# Patient Record
Sex: Male | Born: 1995 | Race: White | Hispanic: No | Marital: Single | Smoking: Never smoker
Health system: Southern US, Community
[De-identification: ages and names within clinical notes are randomized; demographics above are authoritative.]

## PROBLEM LIST (undated history)

## (undated) DIAGNOSIS — J45909 Unspecified asthma, uncomplicated: Secondary | ICD-10-CM

---
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
## 2017-05-25 ENCOUNTER — Encounter: Payer: Self-pay | Admitting: Emergency Medicine

## 2017-05-25 ENCOUNTER — Other Ambulatory Visit: Payer: Self-pay

## 2017-05-25 ENCOUNTER — Emergency Department
Admission: EM | Admit: 2017-05-25 | Discharge: 2017-05-25 | Disposition: A | Discharge status: Home / Self Care | Payer: Self-pay | Attending: Internal Medicine | Admitting: Internal Medicine

## 2017-05-25 DIAGNOSIS — J45909 Unspecified asthma, uncomplicated: Secondary | ICD-10-CM | POA: Insufficient documentation

## 2017-05-25 DIAGNOSIS — B349 Viral infection, unspecified: Secondary | ICD-10-CM | POA: Insufficient documentation

## 2017-05-25 DIAGNOSIS — J029 Acute pharyngitis, unspecified: Secondary | ICD-10-CM | POA: Insufficient documentation

## 2017-05-25 DIAGNOSIS — Z79899 Other long term (current) drug therapy: Secondary | ICD-10-CM | POA: Insufficient documentation

## 2017-05-25 LAB — GROUP A STREP BY PCR: GROUP A STREP BY PCR: NOT DETECTED

## 2017-05-25 MED ORDER — LIDOCAINE VISCOUS 2 % MT SOLN: 10.0000 mL | OROMUCOSAL | 0 refills | Status: DC | PRN

## 2017-05-25 NOTE — ED Provider Notes (Signed)
[facility] Emergency Department Provider Note  ____________________________________________  Time seen: Approximately 8:08 AM  I have reviewed the triage vital signs and the nursing notes.   HISTORY  Chief Complaint Sore Throat    HPI Eric Odonnell is a 22 y.o. male that presents to the emergency department for evaluation of sore throat for 2 days.  He has had a little bit of rhinorrhea.  No fever, nasal congestion, cough, shortness of breath, chest pain, nausea, vomiting, abdominal pain.  Past Medical History:  Diagnosis Date  . Asthma     There are no active problems to display for this patient.   History reviewed. No pertinent surgical history.  Prior to Admission medications   Medication Sig Start Date End Date Taking? Authorizing Provider  ibuprofen (ADVIL,MOTRIN) 600 MG tablet Take 1 tablet (600 mg total) by mouth every 8 (eight) hours as needed. 06/04/16   Tommi RumpsSummers, Rhonda L, PA-C  ibuprofen (ADVIL,MOTRIN) 600 MG tablet Take 1 tablet (600 mg total) by mouth every 8 (eight) hours as needed. 06/05/16   Joni ReiningSmith, Ronald K, PA-C  lidocaine (XYLOCAINE) 2 % solution Use as directed 10 mLs in the mouth or throat as needed for mouth pain. 05/25/17   Enid DerryWagner, Josey Dettmann, PA-C  naproxen (NAPROSYN) 500 MG tablet Take 1 tablet (500 mg total) by mouth 2 (two) times daily with a meal. 08/29/16   Triplett, Cari B, FNP  naproxen (NAPROSYN) 500 MG tablet Take 1 tablet (500 mg total) by mouth 2 (two) times daily with a meal. 11/01/16   Joni ReiningSmith, Ronald K, PA-C  traMADol (ULTRAM) 50 MG tablet Take 1 tablet (50 mg total) by mouth every 6 (six) hours as needed for moderate pain. 06/05/16   Joni ReiningSmith, Ronald K, PA-C  traMADol (ULTRAM) 50 MG tablet Take 1 tablet (50 mg total) by mouth every 6 (six) hours as needed for moderate pain. 11/01/16   Joni ReiningSmith, Ronald K, PA-C    Allergies Patient has no known allergies.  No family history on file.  Social History Social History   Tobacco Use  .  Smoking status: Never Smoker  . Smokeless tobacco: Current User    Types: Chew  Substance Use Topics  . Alcohol use: No  . Drug use: No     Review of Systems  Constitutional: No fever/chills Eyes: No visual changes. No discharge. ENT: Positive for congestion.  Positive for rhinorrhea. Cardiovascular: No chest pain. Respiratory: Positive for cough. No SOB. Gastrointestinal: No abdominal pain.  No nausea, no vomiting.  No diarrhea.  No constipation. Musculoskeletal: Negative for musculoskeletal pain. Skin: Negative for rash, abrasions, lacerations, ecchymosis. Neurological: Negative for headaches.   ____________________________________________   PHYSICAL EXAM:  VITAL SIGNS: ED Triage Vitals  Enc Vitals Group     BP 05/25/17 0648 (!) 114/54     Pulse Rate 05/25/17 0648 (!) 52     Resp 05/25/17 0648 20     Temp 05/25/17 0648 (!) 97.4 F (36.3 C)     Temp Source 05/25/17 0648 Oral     SpO2 05/25/17 0648 100 %     Weight 05/25/17 0641 198 lb (89.8 kg)     Height 05/25/17 0641 6\' 2"  (1.88 m)     Head Circumference --      Peak Flow --      Pain Score 05/25/17 0641 5     Pain Loc --      Pain Edu? --      Excl. in GC? --  Constitutional: Alert and oriented. Well appearing and in no acute distress. Eyes: Conjunctivae are normal. PERRL. EOMI. No discharge. Head: Atraumatic. ENT: No frontal and maxillary sinus tenderness.      Ears: Tympanic membranes pearly gray with good landmarks. No discharge.      Nose: Mild congestion/rhinnorhea.      Mouth/Throat: Mucous membranes are moist. Oropharynx mildly erythematous. Tonsils not enlarged. No exudates. Uvula midline. Neck: No stridor.   Hematological/Lymphatic/Immunilogical: No cervical lymphadenopathy. Cardiovascular: Normal rate, regular rhythm.  Good peripheral circulation. Respiratory: Normal respiratory effort without tachypnea or retractions. Lungs CTAB. Good air entry to the bases with no decreased or absent breath  sounds. Gastrointestinal: Bowel sounds 4 quadrants. Soft and nontender to palpation. No guarding or rigidity. No palpable masses. No distention. Musculoskeletal: Full range of motion to all extremities. No gross deformities appreciated. Neurologic:  Normal speech and language. No gross focal neurologic deficits are appreciated.  Skin:  Skin is warm, dry and intact. No rash noted. Psychiatric: Mood and affect are normal. Speech and behavior are normal. Patient exhibits appropriate insight and judgement.   ____________________________________________   LABS (all labs ordered are listed, but only abnormal results are displayed)  Labs Reviewed  GROUP A STREP BY PCR   ____________________________________________  EKG   ____________________________________________  RADIOLOGY   No results found.  ____________________________________________    PROCEDURES  Procedure(s) performed:    Procedures    Medications - No data to display   ____________________________________________   INITIAL IMPRESSION / ASSESSMENT AND PLAN / ED COURSE  Pertinent labs & imaging results that were available during my care of the patient were reviewed by me and considered in my medical decision making (see chart for details).  Review of the [place] CSRS was performed in accordance of the [facility] prior to dispensing any controlled drugs.     Patient's diagnosis is consistent with viral pharyngitis. Vital signs and exam are reassuring.  Strep test is negative.  Patient appears well and is staying well hydrated. Patient feels comfortable going home. Patient will be discharged home with prescriptions for viscous lidocaine. Patient is to follow up with PCP as needed or otherwise directed. Patient is given ED precautions to return to the ED for any worsening or new symptoms.     ____________________________________________  FINAL CLINICAL IMPRESSION(S) / ED DIAGNOSES  Final diagnoses:  Viral  pharyngitis      NEW MEDICATIONS STARTED DURING THIS VISIT:  ED Discharge Orders        Ordered    lidocaine (XYLOCAINE) 2 % solution  As needed     05/25/17 0811          This chart was dictated using voice recognition software/Dragon. Despite best efforts to proofread, errors can occur which can change the meaning. Any change was purely unintentional.    Enid Derry, PA-C 05/25/17 0831    Emily Filbert, MD 05/25/17 [phone]

## 2017-05-25 NOTE — ED Notes (Signed)
See triage note  Presents with sore throat for the past 2 days   Unsure of fever...afebrile on arrival  States having increased pain with swallowing

## 2017-05-25 NOTE — ED Triage Notes (Signed)
Patient ambulatory to triage with steady gait, without difficulty or distress noted; pt reports sore throat x 2 days 

## 2019-02-02 ENCOUNTER — Other Ambulatory Visit: Payer: Self-pay

## 2019-02-02 ENCOUNTER — Encounter: Payer: Self-pay | Admitting: Emergency Medicine

## 2019-02-02 ENCOUNTER — Emergency Department
Admission: EM | Admit: 2019-02-02 | Discharge: 2019-02-02 | Disposition: A | Discharge status: Home / Self Care | Payer: Medicaid Other | Attending: Emergency Medicine | Admitting: Emergency Medicine

## 2019-02-02 DIAGNOSIS — F17220 Nicotine dependence, chewing tobacco, uncomplicated: Secondary | ICD-10-CM | POA: Insufficient documentation

## 2019-02-02 DIAGNOSIS — X503XXA Overexertion from repetitive movements, initial encounter: Secondary | ICD-10-CM | POA: Insufficient documentation

## 2019-02-02 DIAGNOSIS — S39012A Strain of muscle, fascia and tendon of lower back, initial encounter: Secondary | ICD-10-CM

## 2019-02-02 DIAGNOSIS — Y999 Unspecified external cause status: Secondary | ICD-10-CM | POA: Insufficient documentation

## 2019-02-02 DIAGNOSIS — Y929 Unspecified place or not applicable: Secondary | ICD-10-CM | POA: Insufficient documentation

## 2019-02-02 DIAGNOSIS — Y9389 Activity, other specified: Secondary | ICD-10-CM | POA: Insufficient documentation

## 2019-02-02 DIAGNOSIS — J45909 Unspecified asthma, uncomplicated: Secondary | ICD-10-CM | POA: Insufficient documentation

## 2019-02-02 MED ORDER — NAPROXEN 500 MG PO TABS: 500.0000 mg | ORAL_TABLET | Freq: Two times a day (BID) | ORAL | 0 refills | Status: AC

## 2019-02-02 MED ORDER — KETOROLAC TROMETHAMINE 30 MG/ML IJ SOLN
30.0000 mg | Freq: Once | INTRAMUSCULAR | Status: AC
  Administered 2019-02-02: 11:00:00 30 mg via INTRAMUSCULAR
  Filled 2019-02-02: qty 1

## 2019-02-02 NOTE — ED Notes (Signed)
See triage note  Presents with lower back pain  States he thinks he may have hurt his back on Friday  Pain was on left and moved into left leg  States now pain is only in left lower back  Ambulates well to treatment room

## 2019-02-02 NOTE — ED Provider Notes (Signed)
[facility] Emergency Department Provider Note   ____________________________________________   First MD Initiated Contact with Patient 02/02/19 [phone]     (approximate)  I have reviewed the triage vital signs and the nursing notes.   HISTORY  Chief Complaint Back Pain   HPI Eric Odonnell is a 23 y.o. male presents to the ED with complaint of low back pain.  Patient states that he was shoveling gravel 6 days ago and afterwards he had pain.  He states that there was no injury and that he did not fall and nothing fell on him.  He has taken ibuprofen 800 mg 1 a day.  He denies any previous back problems.  There is been no saddle anesthesias or incontinence of bowel or bladder.  He does have some mild isolated radiation to his left hip area.  He rates his pain as an 8 out of 10.      Past Medical History:  Diagnosis Date  . Asthma     There are no active problems to display for this patient.   History reviewed. No pertinent surgical history.  Prior to Admission medications   Medication Sig Start Date End Date Taking? Authorizing Provider  naproxen (NAPROSYN) 500 MG tablet Take 1 tablet (500 mg total) by mouth 2 (two) times daily with a meal. 02/02/19   Johnn Hai, PA-C    Allergies Patient has no known allergies.  No family history on file.  Social History Social History   Tobacco Use  . Smoking status: Never Smoker  . Smokeless tobacco: Current User    Types: Chew  Substance Use Topics  . Alcohol use: No  . Drug use: No    Review of Systems Constitutional: No fever/chills Cardiovascular: Denies chest pain. Respiratory: Denies shortness of breath. Gastrointestinal: No abdominal pain.  No nausea, no vomiting.  No diarrhea.  Genitourinary: Negative for dysuria. Musculoskeletal: Positive for low back pain. Skin: Negative for rash. Neurological: Negative for headaches, focal weakness or numbness.  ____________________________________________   PHYSICAL EXAM:  VITAL SIGNS: ED Triage Vitals  Enc Vitals Group     BP 02/02/19 0843 (!) 143/72     Pulse Rate 02/02/19 0843 68     Resp 02/02/19 0843 20     Temp 02/02/19 0843 97.8 F (36.6 C)     Temp Source 02/02/19 0843 Oral     SpO2 02/02/19 0843 100 %     Weight 02/02/19 0840 200 lb (90.7 kg)     Height 02/02/19 0840 6\' 1"  (1.854 m)     Head Circumference --      Peak Flow --      Pain Score 02/02/19 0840 8     Pain Loc --      Pain Edu? --      Excl. in [place]? --     Constitutional: Alert and oriented. Well appearing and in no acute distress. Eyes: Conjunctivae are normal.  Head: Atraumatic. Neck: No stridor.   Cardiovascular: Normal rate, regular rhythm. Grossly normal heart sounds.  Good peripheral circulation. Respiratory: Normal respiratory effort.  No retractions. Lungs CTAB. Gastrointestinal: Soft and nontender. No distention.  No CVA tenderness. Musculoskeletal: On examination of the back there is no gross deformity.  There is marked tenderness on palpation of the sacral area and SI joint on the left.  There is no active muscle spasms.  There is no evidence of injury such as discoloration or abrasions.  Good muscle strength bilaterally.  Straight  leg raises are minimally positive on the left. Neurologic:  Normal speech and language. No gross focal neurologic deficits are appreciated.  Reflexes are 1+ bilaterally.  No gait instability. Skin:  Skin is warm, dry and intact. No rash noted. Psychiatric: Mood and affect are normal. Speech and behavior are normal.  ____________________________________________   LABS (all labs ordered are listed, but only abnormal results are displayed)  Labs Reviewed - No data to display  PROCEDURES  Procedure(s) performed (including Critical Care):  Procedures   ____________________________________________   INITIAL IMPRESSION / ASSESSMENT AND PLAN / ED COURSE  As part of my  medical decision making, I reviewed the following data within the electronic MEDICAL RECORD NUMBER Notes from prior ED visits and [place] Controlled Substance Database  23 year old male presents to the ED with complaint of low back pain.  Patient states that the pain has been there for 6 days.  He has been taking ibuprofen 800 mg once a day.  He denies any direct trauma to his back and states that he was using a shovel when his back began hurting.  Neurologically he is intact.  Patient had minimal tenderness on palpation of the lumbar spine but was more tender on the left SI joint area.  Patient was given Toradol injection 30 mg while in the ED.  He was discharged with a prescription for naproxen 500 mg twice daily which may be easier for him to take since he does not like taking medication.  He is to follow-up with his PCP if any continued problems.  ____________________________________________   FINAL CLINICAL IMPRESSION(S) / ED DIAGNOSES  Final diagnoses:  Strain of lumbar region, initial encounter     ED Discharge Orders         Ordered    naproxen (NAPROSYN) 500 MG tablet  2 times daily with meals     02/02/19 1130           Note:  This document was prepared using Dragon voice recognition software and may include unintentional dictation errors.    Tommi Rumps, PA-C 02/02/19 1138    Jene Every, MD 02/02/19 1255

## 2019-02-02 NOTE — ED Triage Notes (Signed)
Pt reports at work Friday he was shoveling gravel and hurt his lower back. Pt reports pain radiates down his left leg as well. Pt does not wish to file workers comp.

## 2019-02-02 NOTE — Discharge Instructions (Signed)
Follow-up with your primary care provider if any continued problems.  Begin taking medication as directed.  This medication will be easier for you to take as it is only twice a day instead of 3 times a day like or ibuprofen.  Use ice or heat to your back as needed for discomfort.  If any severe worsening of your symptoms over the weekend return to the emergency department.

## 2019-04-01 ENCOUNTER — Encounter: Payer: Self-pay | Admitting: Emergency Medicine

## 2019-04-01 ENCOUNTER — Emergency Department
Admission: EM | Admit: 2019-04-01 | Discharge: 2019-04-01 | Disposition: A | Discharge status: Home / Self Care | Payer: Medicaid Other | Attending: Emergency Medicine | Admitting: Emergency Medicine

## 2019-04-01 ENCOUNTER — Other Ambulatory Visit: Payer: Self-pay

## 2019-04-01 DIAGNOSIS — Z791 Long term (current) use of non-steroidal anti-inflammatories (NSAID): Secondary | ICD-10-CM | POA: Insufficient documentation

## 2019-04-01 DIAGNOSIS — A09 Infectious gastroenteritis and colitis, unspecified: Secondary | ICD-10-CM | POA: Insufficient documentation

## 2019-04-01 DIAGNOSIS — J45909 Unspecified asthma, uncomplicated: Secondary | ICD-10-CM | POA: Insufficient documentation

## 2019-04-01 DIAGNOSIS — Z20822 Contact with and (suspected) exposure to covid-19: Secondary | ICD-10-CM | POA: Insufficient documentation

## 2019-04-01 LAB — COMPREHENSIVE METABOLIC PANEL
ALT: 18 U/L (ref 0–44)
AST: 17 U/L (ref 15–41)
Albumin: 4.8 g/dL (ref 3.5–5.0)
Alkaline Phosphatase: 71 U/L (ref 38–126)
Anion gap: 12 (ref 5–15)
BUN: 11 mg/dL (ref 6–20)
CO2: 26 mmol/L (ref 22–32)
Calcium: 9.5 mg/dL (ref 8.9–10.3)
Chloride: 99 mmol/L (ref 98–111)
Creatinine, Ser: 0.84 mg/dL (ref 0.61–1.24)
GFR calc Af Amer: >60 mL/min (ref >60)
GFR calc non Af Amer: >60 mL/min (ref >60)
Glucose, Bld: 100 mg/dL — ABNORMAL HIGH (ref 70–99)
Potassium: 3.4 mmol/L — ABNORMAL LOW (ref 3.5–5.1)
Sodium: 137 mmol/L (ref 135–145)
Total Bilirubin: 1.1 mg/dL (ref 0.3–1.2)
Total Protein: 8.5 g/dL — ABNORMAL HIGH (ref 6.5–8.1)

## 2019-04-01 LAB — C DIFFICILE QUICK SCREEN W PCR REFLEX
C Diff antigen: NEGATIVE
C Diff interpretation: NOT DETECTED
C Diff toxin: NEGATIVE

## 2019-04-01 LAB — URINALYSIS, COMPLETE (UACMP) WITH MICROSCOPIC
Bacteria, UA: NONE SEEN
Bilirubin Urine: NEGATIVE
Glucose, UA: NEGATIVE mg/dL
Hgb urine dipstick: NEGATIVE
Ketones, ur: NEGATIVE mg/dL
Leukocytes,Ua: NEGATIVE
Nitrite: NEGATIVE
Protein, ur: 30 mg/dL — AB
Specific Gravity, Urine: 1.038 — ABNORMAL HIGH (ref 1.005–1.030)
pH: 5 (ref 5.0–8.0)

## 2019-04-01 LAB — CBC
HCT: 42.2 % (ref 39.0–52.0)
Hemoglobin: 15.4 g/dL (ref 13.0–17.0)
MCH: 28.8 pg (ref 26.0–34.0)
MCHC: 36.5 g/dL — ABNORMAL HIGH (ref 30.0–36.0)
MCV: 78.9 fL — ABNORMAL LOW (ref 80.0–100.0)
Platelets: 237 10*3/uL (ref 150–400)
RBC: 5.35 MIL/uL (ref 4.22–5.81)
RDW: 12.1 % (ref 11.5–15.5)
WBC: 6.8 10*3/uL (ref 4.0–10.5)
nRBC: 0 % (ref 0.0–0.2)

## 2019-04-01 LAB — POC SARS CORONAVIRUS 2 AG: SARS Coronavirus 2 Ag: NEGATIVE

## 2019-04-01 LAB — LIPASE, BLOOD: Lipase: 23 U/L (ref 11–51)

## 2019-04-01 MED ORDER — LOPERAMIDE HCL 2 MG PO CAPS
4.0000 mg | ORAL_CAPSULE | Freq: Once | ORAL | Status: AC
  Administered 2019-04-01: 22:00:00 4 mg via ORAL
  Filled 2019-04-01: qty 2

## 2019-04-01 MED ORDER — SODIUM CHLORIDE 0.9 % IV BOLUS
1000.0000 mL | Freq: Once | INTRAVENOUS | Status: AC
  Administered 2019-04-01: 21:00:00 1000 mL via INTRAVENOUS

## 2019-04-01 MED ORDER — LOPERAMIDE HCL 2 MG PO TABS: 2.0000 mg | ORAL_TABLET | Freq: Four times a day (QID) | ORAL | 0 refills | Status: AC | PRN

## 2019-04-01 MED ORDER — CIPROFLOXACIN HCL 500 MG PO TABS: 500.0000 mg | ORAL_TABLET | Freq: Two times a day (BID) | ORAL | 0 refills | Status: AC

## 2019-04-01 NOTE — ED Triage Notes (Signed)
Pt arrived via POV with reports of after eating dinner last night started having diarrhea and vomiting along with abdominal pain.   Pt states other people ate the same food and did not get sick.  Pt c/o tightness in abdomen.

## 2019-04-01 NOTE — ED Provider Notes (Signed)
[facility] Emergency Department Provider Note  ____________________________________________   First MD Initiated Contact with Patient 04/01/19 2004     (approximate)  I have reviewed the triage vital signs and the nursing notes.   HISTORY  Chief Complaint   HPI Eric Odonnell is a 24 y.o. male presents to the emergency department secondary to a 2-day history of nonbloody diarrhea.  Patient denies any nausea or vomiting.  Patient denies any fever.  Patient does admit to abdominal "tightness" however no pain.  Patient denies any sick contact.        Past Medical History:  Diagnosis Date  . Asthma     There are no problems to display for this patient.   History reviewed. No pertinent surgical history.  Prior to Admission medications   Medication Sig Start Date End Date Taking? Authorizing Provider  naproxen (NAPROSYN) 500 MG tablet Take 1 tablet (500 mg total) by mouth 2 (two) times daily with a meal. 02/02/19   Tommi Rumps, PA-C    Allergies Patient has no known allergies.  No family history on file.  Social History Social History   Tobacco Use  . Smoking status: Never Smoker  . Smokeless tobacco: Current User    Types: Chew  Substance Use Topics  . Alcohol use: No  . Drug use: No    Review of Systems Constitutional: No fever/chills Eyes: No visual changes. ENT: No sore throat. Cardiovascular: Denies chest pain. Respiratory: Denies shortness of breath. Gastrointestinal: No abdominal pain.  No nausea, no vomiting.  Positive for diarrhea.  No constipation. Genitourinary: Negative for dysuria. Musculoskeletal: Negative for neck pain.  Negative for back pain. Integumentary: Negative for rash. Neurological: Negative for headaches, focal weakness or numbness.   ____________________________________________   PHYSICAL EXAM:  VITAL SIGNS: ED Triage Vitals  Enc Vitals Group     BP 04/01/19 1602 124/79     Pulse Rate  04/01/19 1602 85     Resp 04/01/19 1602 17     Temp 04/01/19 1602 97.9 F (36.6 C)     Temp Source 04/01/19 1602 Oral     SpO2 04/01/19 1602 100 %     Weight 04/01/19 1602 90.7 kg (200 lb)     Height 04/01/19 1602 1.854 m (6\' 1" )     Head Circumference --      Peak Flow --      Pain Score 04/01/19 1612 7     Pain Loc --      Pain Edu? --      Excl. in GC? --     Constitutional: Alert and oriented.  Eyes: Conjunctivae are normal.  Mouth/Throat: Patient is wearing a mask. Neck: No stridor.  No meningeal signs.   Cardiovascular: Normal rate, regular rhythm. Good peripheral circulation. Grossly normal heart sounds. Respiratory: Normal respiratory effort.  No retractions. Gastrointestinal: Soft and nontender. No distention.   Musculoskeletal: No lower extremity tenderness nor edema. No gross deformities of extremities. Neurologic:  Normal speech and language. No gross focal neurologic deficits are appreciated.  Skin:  Skin is warm, dry and intact. Psychiatric: Mood and affect are normal. Speech and behavior are normal.  ____________________________________________   LABS (all labs ordered are listed, but only abnormal results are displayed)  Labs Reviewed  COMPREHENSIVE METABOLIC PANEL - Abnormal; Notable for the following components:      Result Value   Potassium 3.4 (*)    Glucose, Bld 100 (*)    Total Protein 8.5 (*)  All other components within normal limits  CBC - Abnormal; Notable for the following components:   MCV 78.9 (*)    MCHC 36.5 (*)    All other components within normal limits  URINALYSIS, COMPLETE (UACMP) WITH MICROSCOPIC - Abnormal; Notable for the following components:   Color, Urine AMBER (*)    APPearance HAZY (*)    Specific Gravity, Urine 1.038 (*)    Protein, ur 30 (*)    All other components within normal limits  GI PATHOGEN PANEL BY PCR, STOOL  C DIFFICILE QUICK SCREEN W PCR REFLEX  LIPASE, BLOOD      Procedures   ____________________________________________   INITIAL IMPRESSION / MDM / ASSESSMENT AND PLAN / ED COURSE  As part of my medical decision making, I reviewed the following data within the electronic MEDICAL RECORD NUMBER   24 year old male presented with above-stated history and physical exam consistent with infectious diarrhea.  Given absence of abdominal pain no imaging performed.  Patient given Imodium in the emergency department after stool samples obtained.  Stool samples pending at this time.  Patient also received 2 L IV normal saline     ____________________________________________  FINAL CLINICAL IMPRESSION(S) / ED DIAGNOSES  Final diagnoses:  Infectious diarrhea     MEDICATIONS GIVEN DURING THIS VISIT:  Medications  sodium chloride 0.9 % bolus 1,000 mL (1,000 mLs Intravenous New Bag/Given 04/01/19 2048)     ED Discharge Orders    None      *Please note:  [name] was evaluated in Emergency Department on 04/01/2019 for the symptoms described in the history of present illness. He was evaluated in the context of the global COVID-19 pandemic, which necessitated consideration that the patient might be at risk for infection with the SARS-CoV-2 virus that causes COVID-19. Institutional protocols and algorithms that pertain to the evaluation of patients at risk for COVID-19 are in a state of rapid change based on information released by regulatory bodies including the CDC and federal and state organizations. These policies and algorithms were followed during the patient's care in the ED.  Some ED evaluations and interventions may be delayed as a result of limited staffing during the pandemic.*  Note:  This document was prepared using Dragon voice recognition software and may include unintentional dictation errors.   Gregor Hams, MD 04/01/19 2147

## 2019-04-03 ENCOUNTER — Ambulatory Visit: Payer: Medicaid Other | Attending: Internal Medicine

## 2019-04-03 LAB — GI PATHOGEN PANEL BY PCR, STOOL
Adenovirus F 40/41: NOT DETECTED
Astrovirus: NOT DETECTED
Campylobacter by PCR: NOT DETECTED
Cryptosporidium by PCR: NOT DETECTED
Cyclospora cayetanensis: NOT DETECTED
E coli (ETEC) LT/ST: NOT DETECTED
E coli (STEC): NOT DETECTED
Entamoeba histolytica: NOT DETECTED
Enteroaggregative E coli: NOT DETECTED
Enteropathogenic E coli: NOT DETECTED
G lamblia by PCR: NOT DETECTED
Norovirus GI/GII: NOT DETECTED
Plesiomonas shigelloides: NOT DETECTED
Rotavirus A by PCR: DETECTED — AB
Salmonella by PCR: NOT DETECTED
Sapovirus: NOT DETECTED
Shigella by PCR: NOT DETECTED
Vibrio cholerae: NOT DETECTED
Vibrio: NOT DETECTED
Yersinia enterocolitica: NOT DETECTED

## 2019-09-07 ENCOUNTER — Other Ambulatory Visit: Payer: Self-pay | Admitting: Emergency Medicine

## 2019-09-07 ENCOUNTER — Encounter: Payer: Self-pay | Admitting: Emergency Medicine

## 2019-09-07 ENCOUNTER — Emergency Department: Payer: Medicaid Other

## 2019-09-07 ENCOUNTER — Emergency Department
Admission: EM | Admit: 2019-09-07 | Discharge: 2019-09-07 | Disposition: A | Discharge status: Home / Self Care | Payer: Medicaid Other | Attending: Emergency Medicine | Admitting: Emergency Medicine

## 2019-09-07 ENCOUNTER — Other Ambulatory Visit: Payer: Self-pay

## 2019-09-07 DIAGNOSIS — R350 Frequency of micturition: Secondary | ICD-10-CM | POA: Insufficient documentation

## 2019-09-07 DIAGNOSIS — F17220 Nicotine dependence, chewing tobacco, uncomplicated: Secondary | ICD-10-CM | POA: Insufficient documentation

## 2019-09-07 DIAGNOSIS — J45909 Unspecified asthma, uncomplicated: Secondary | ICD-10-CM | POA: Insufficient documentation

## 2019-09-07 DIAGNOSIS — N201 Calculus of ureter: Secondary | ICD-10-CM | POA: Insufficient documentation

## 2019-09-07 DIAGNOSIS — R112 Nausea with vomiting, unspecified: Secondary | ICD-10-CM | POA: Insufficient documentation

## 2019-09-07 DIAGNOSIS — R1032 Left lower quadrant pain: Secondary | ICD-10-CM | POA: Insufficient documentation

## 2019-09-07 MED ORDER — KETOROLAC TROMETHAMINE 60 MG/2ML IM SOLN: 15.0000 mg | Freq: Once | INTRAMUSCULAR | Status: AC

## 2019-09-07 MED ORDER — KETOROLAC TROMETHAMINE 30 MG/ML IJ SOLN
INTRAMUSCULAR | Status: AC
  Administered 2019-09-07: 15 mg via INTRAMUSCULAR
  Filled 2019-09-07: qty 1

## 2019-09-07 MED ORDER — ONDANSETRON 4 MG PO TBDP: 4.0000 mg | ORAL_TABLET | Freq: Three times a day (TID) | ORAL | 0 refills | Status: AC | PRN

## 2019-09-07 MED ORDER — KETOROLAC TROMETHAMINE 10 MG PO TABS: 10.0000 mg | ORAL_TABLET | Freq: Four times a day (QID) | ORAL | 0 refills | Status: DC | PRN

## 2019-09-07 NOTE — ED Notes (Signed)
E-signature not working at this time. Pt verbalized understanding of D/C instructions, prescriptions and follow up care with no further questions at this time. Pt in NAD and ambulatory at time of D/C.  

## 2019-09-07 NOTE — ED Triage Notes (Signed)
Pt presents via POV with left sided abdominal pain that began last night around 10pm. Pt endorses intermittent N/V as well. Pt alert and oriented x4,

## 2019-09-07 NOTE — Discharge Instructions (Signed)
Your CT scan shows a 2-millimeter kidney stone on the left side, just outside your bladder. This should pass on its own today.  After that, your pain should dramatically improve. Take toradol every 6 hours in the meantime to control pain.

## 2019-09-07 NOTE — ED Provider Notes (Signed)
[facility] Emergency Department Provider Note  ____________________________________________  Time seen: Approximately 9:06 AM  I have reviewed the triage vital signs and the nursing notes.   HISTORY  Chief Complaint Abdominal Pain    HPI Eric Odonnell is a 24 y.o. male with past medical history of asthma who comes the ED complaining of left flank pain radiating to left lower quadrant that began last night at about 10 PM.  Constant, waxing and waning, no aggravating or alleviating factors.  Has some nausea and vomiting as well.  No fevers or chills, no diarrhea or constipation.  Does endorse urinary frequency without dysuria or penile discharge.      Past Medical History:  Diagnosis Date  . Asthma      There are no problems to display for this patient.    History reviewed. No pertinent surgical history.   Prior to Admission medications   Medication Sig Start Date End Date Taking? Authorizing Provider  ketorolac (TORADOL) 10 MG tablet Take 1 tablet (10 mg total) by mouth every 6 (six) hours as needed for moderate pain. 09/07/19   Sharman Cheek, MD  loperamide (IMODIUM A-D) 2 MG tablet Take 1 tablet (2 mg total) by mouth 4 (four) times daily as needed for diarrhea or loose stools. 04/01/19   Darci Current, MD  naproxen (NAPROSYN) 500 MG tablet Take 1 tablet (500 mg total) by mouth 2 (two) times daily with a meal. 02/02/19   Bridget Hartshorn L, PA-C  ondansetron (ZOFRAN ODT) 4 MG disintegrating tablet Take 1 tablet (4 mg total) by mouth every 8 (eight) hours as needed for nausea or vomiting. 09/07/19   Sharman Cheek, MD     Allergies Patient has no known allergies.   History reviewed. No pertinent family history.  Social History Social History   Tobacco Use  . Smoking status: Never Smoker  . Smokeless tobacco: Current User    Types: Chew  Substance Use Topics  . Alcohol use: No  . Drug use: No    Review of  Systems  Constitutional:   No fever or chills.  ENT:   No sore throat. No rhinorrhea. Cardiovascular:   No chest pain or syncope. Respiratory:   No dyspnea or cough. Gastrointestinal:   Positive as above for left flank pain and vomiting. Musculoskeletal:   Negative for focal pain or swelling All other systems reviewed and are negative except as documented above in ROS and HPI.  ____________________________________________   PHYSICAL EXAM:  VITAL SIGNS: ED Triage Vitals  Enc Vitals Group     BP 09/07/19 0751 (!) 149/81     Pulse Rate 09/07/19 0751 (!) 55     Resp 09/07/19 0751 17     Temp 09/07/19 0751 97.7 F (36.5 C)     Temp src --      SpO2 09/07/19 0751 100 %     Weight 09/07/19 0752 203 lb (92.1 kg)     Height 09/07/19 0752 6\' 1"  (1.854 m)     Head Circumference --      Peak Flow --      Pain Score 09/07/19 0752 6     Pain Loc --      Pain Edu? --      Excl. in GC? --     Vital signs reviewed, nursing assessments reviewed.   Constitutional:   Alert and oriented. Non-toxic appearance. Eyes:   Conjunctivae are normal. EOMI. PERRL. ENT      Head:  Normocephalic and atraumatic.      Nose:   Wearing a mask.      Mouth/Throat:   Wearing a mask.      Neck:   No meningismus. Full ROM. Hematological/Lymphatic/Immunilogical:   No cervical lymphadenopathy. Cardiovascular:   RRR. Symmetric bilateral radial and DP pulses.  No murmurs. Cap refill less than 2 seconds. Respiratory:   Normal respiratory effort without tachypnea/retractions. Breath sounds are clear and equal bilaterally. No wheezes/rales/rhonchi. Gastrointestinal:   Soft with mild left lower quadrant tenderness.  Non distended. There is mild left CVA tenderness.  No rebound, rigidity, or guarding.  Musculoskeletal:   Normal range of motion in all extremities. No joint effusions.  No lower extremity tenderness.  No edema. Neurologic:   Normal speech and language.  Motor grossly intact. No acute focal  neurologic deficits are appreciated.  Skin:    Skin is warm, dry and intact. No rash noted.  No petechiae, purpura, or bullae.  ____________________________________________    LABS (pertinent positives/negatives) (all labs ordered are listed, but only abnormal results are displayed) Labs Reviewed  URINALYSIS, COMPLETE (UACMP) WITH MICROSCOPIC   ____________________________________________   EKG    ____________________________________________    RADIOLOGY  CT Renal Stone Study  Result Date: 09/07/2019 CLINICAL DATA:  Left side abdominal pain EXAM: CT ABDOMEN AND PELVIS WITHOUT CONTRAST TECHNIQUE: Multidetector CT imaging of the abdomen and pelvis was performed following the standard protocol without IV contrast. COMPARISON:  None. FINDINGS: Lower chest: Lung bases are clear. No effusions. Heart is normal size. Hepatobiliary: No focal hepatic abnormality. Gallbladder unremarkable. Pancreas: No focal abnormality or ductal dilatation. Spleen: No focal abnormality.  Normal size. Adrenals/Urinary Tract: Mild left hydronephrosis and perinephric stranding due to punctate 2 mm distal left ureteral stone. No stones or hydronephrosis on the right. Adrenal glands and urinary bladder unremarkable. Stomach/Bowel: Normal appendix. Stomach, large and small bowel grossly unremarkable. Vascular/Lymphatic: No evidence of aneurysm or adenopathy. Reproductive: No visible focal abnormality. Other: No free fluid or free air. Musculoskeletal: No acute bony abnormality. IMPRESSION: 2 mm distal left ureteral stone with mild left hydronephrosis and perinephric stranding. Electronically Signed   By: Rolm Baptise M.D.   On: 09/07/2019 08:29    ____________________________________________   PROCEDURES Procedures  ____________________________________________  DIFFERENTIAL DIAGNOSIS   Ureterolithiasis, ureteral obstruction, constipation, intestinal gas  CLINICAL IMPRESSION / ASSESSMENT AND PLAN / ED  COURSE  Medications ordered in the ED: Medications  ketorolac (TORADOL) injection 15 mg (15 mg Intramuscular Given 09/07/19 0803)    Pertinent labs & imaging results that were available during my care of the patient were reviewed by me and considered in my medical decision making (see chart for details).  Eric Odonnell was evaluated in Emergency Department on 09/07/2019 for the symptoms described in the history of present illness. He was evaluated in the context of the global COVID-19 pandemic, which necessitated consideration that the patient might be at risk for infection with the SARS-CoV-2 virus that causes COVID-19. Institutional protocols and algorithms that pertain to the evaluation of patients at risk for COVID-19 are in a state of rapid change based on information released by regulatory bodies including the CDC and federal and state organizations. These policies and algorithms were followed during the patient's care in the ED.   Patient presents with left flank pain, most likely kidney stone.  Vital signs are normal, symptoms and exam not consistent with UTI or pyelonephritis.  CT scan obtained which does show a 2 mm stone in the distal  left ureter, not obstructive.  Will give Toradol and Zofran for symptom control, plan outpatient follow-up.      ____________________________________________   FINAL CLINICAL IMPRESSION(S) / ED DIAGNOSES    Final diagnoses:  Ureterolithiasis     ED Discharge Orders         Ordered    ketorolac (TORADOL) 10 MG tablet  Every 6 hours PRN     Discontinue  Reprint     09/07/19 0902    ondansetron (ZOFRAN ODT) 4 MG disintegrating tablet  Every 8 hours PRN     Discontinue  Reprint     09/07/19 0902          Portions of this note were generated with dragon dictation software. Dictation errors may occur despite best attempts at proofreading.   Sharman Cheek, MD 09/07/19 [phone]

## 2019-12-20 ENCOUNTER — Emergency Department
Admission: EM | Admit: 2019-12-20 | Discharge: 2019-12-20 | Disposition: A | Discharge status: Home / Self Care | Payer: Medicaid Other | Attending: Emergency Medicine | Admitting: Emergency Medicine

## 2019-12-20 ENCOUNTER — Encounter: Payer: Self-pay | Admitting: Emergency Medicine

## 2019-12-20 DIAGNOSIS — Z0289 Encounter for other administrative examinations: Secondary | ICD-10-CM | POA: Insufficient documentation

## 2019-12-20 DIAGNOSIS — J45909 Unspecified asthma, uncomplicated: Secondary | ICD-10-CM | POA: Insufficient documentation

## 2019-12-20 DIAGNOSIS — Z008 Encounter for other general examination: Secondary | ICD-10-CM

## 2019-12-20 NOTE — ED Notes (Signed)
This RN used provided Iodine pad, needle Vacutainer and 2 glass tubes from [employer] officer and used tourniquet and band aid provided by hospital. 2 glass tubes filler with blood provided back to [employer] officer. Present in room during draw, [employer] officers x3 and this RN.

## 2019-12-20 NOTE — Discharge Instructions (Signed)
Mr. Plemmons is medically cleared for incarceration.

## 2019-12-20 NOTE — ED Provider Notes (Signed)
[facility] Emergency Department Provider Note  ____________________________________________   First MD Initiated Contact with Patient 12/20/19 0448     (approximate)  I have reviewed the triage vital signs and the nursing notes.   HISTORY  Chief Complaint Blood Draw/Medical Clearance    HPI Eric Odonnell is a 24 y.o. male with no contributory chronic medical issues who presents for clearance for incarceration.  He is in the custody of law enforcement who pulled him over for driving under the influence.  He was not involved in a motor vehicle collision.  The patient states that he has no medical complaints or concerns.  Specifically he denies headache, chest pain, shortness of breath, nausea, vomiting, and abdominal pain.  He has already received a forensic blood draw and says he is fine.         Past Medical History:  Diagnosis Date  . Asthma     There are no problems to display for this patient.   History reviewed. No pertinent surgical history.  Prior to Admission medications   Medication Sig Start Date End Date Taking? Authorizing Provider  ketorolac (TORADOL) 10 MG tablet Take 1 tablet (10 mg total) by mouth every 6 (six) hours as needed for moderate pain. 09/07/19   Sharman Cheek, MD  loperamide (IMODIUM A-D) 2 MG tablet Take 1 tablet (2 mg total) by mouth 4 (four) times daily as needed for diarrhea or loose stools. 04/01/19   Darci Current, MD  naproxen (NAPROSYN) 500 MG tablet Take 1 tablet (500 mg total) by mouth 2 (two) times daily with a meal. 02/02/19   Bridget Hartshorn L, PA-C  ondansetron (ZOFRAN ODT) 4 MG disintegrating tablet Take 1 tablet (4 mg total) by mouth every 8 (eight) hours as needed for nausea or vomiting. 09/07/19   Sharman Cheek, MD    Allergies Patient has no known allergies.  History reviewed. No pertinent family history.  Social History Social History   Tobacco Use  . Smoking status: Never Smoker  .  Smokeless tobacco: Current User    Types: Chew  Substance Use Topics  . Alcohol use: No  . Drug use: No    Review of Systems Constitutional: No fever/chills Cardiovascular: Denies chest pain. Respiratory: Denies shortness of breath. Gastrointestinal: No abdominal pain.  No nausea, no vomiting.   Musculoskeletal: Negative for neck pain.  Negative for back pain.  ____________________________________________   PHYSICAL EXAM:  VITAL SIGNS: ED Triage Vitals [12/20/19 0254]  Enc Vitals Group     BP 139/85     Pulse Rate 97     Resp 17     Temp 98.3 F (36.8 C)     Temp Source Oral     SpO2 100 %     Weight      Height      Head Circumference      Peak Flow      Pain Score      Pain Loc      Pain Edu?      Excl. in GC?     Constitutional: Alert and oriented.  Eyes: Conjunctivae are normal.  Cardiovascular: Normal rate, regular rhythm. Good peripheral circulation. Respiratory: Normal respiratory effort.  No retractions. Musculoskeletal: No lower extremity tenderness nor edema. No gross deformities of extremities. Neurologic:  Normal speech and language. No gross focal neurologic deficits are appreciated.  Skin:  Skin is warm, dry and intact. Psychiatric: Mood and affect are normal. Speech and behavior are normal.  ____________________________________________   LABS (all labs ordered are listed, but only abnormal results are displayed)  Labs Reviewed - No data to display ____________________________________________  EKG  No indication for emergent EKG ____________________________________________  RADIOLOGY I, Loleta Rose, personally viewed and evaluated these images (plain radiographs) as part of my medical decision making, as well as reviewing the written report by the radiologist.  ED MD interpretation: No indication for emergent imaging  Official radiology report(s): No results  found.  ____________________________________________   PROCEDURES   Procedure(s) performed (including Critical Care):  Procedures   ____________________________________________   INITIAL IMPRESSION / MDM / ASSESSMENT AND PLAN / ED COURSE  As part of my medical decision making, I reviewed the following data within the electronic MEDICAL RECORD NUMBER Nursing notes reviewed and incorporated, Old chart reviewed and Notes from prior ED visits   Vital signs are stable, patient has no complaints, physical exam is reassuring.  No indication for further evaluation or work-up.  Patient is cleared for incarceration.      ____________________________________________  FINAL CLINICAL IMPRESSION(S) / ED DIAGNOSES  Final diagnoses:  Medical clearance for incarceration     MEDICATIONS GIVEN DURING THIS VISIT:  Medications - No data to display   ED Discharge Orders    None      *Please note:  Eric Odonnell was evaluated in Emergency Department on 12/20/2019 for the symptoms described in the history of present illness. He was evaluated in the context of the global COVID-19 pandemic, which necessitated consideration that the patient might be at risk for infection with the SARS-CoV-2 virus that causes COVID-19. Institutional protocols and algorithms that pertain to the evaluation of patients at risk for COVID-19 are in a state of rapid change based on information released by regulatory bodies including the CDC and federal and state organizations. These policies and algorithms were followed during the patient's care in the ED.  Some ED evaluations and interventions may be delayed as a result of limited staffing during and after the pandemic.*  Note:  This document was prepared using Dragon voice recognition software and may include unintentional dictation errors.   Loleta Rose, MD 12/20/19 [phone]

## 2019-12-20 NOTE — ED Triage Notes (Signed)
Pt arrived via [employer] for forensic blood draw and medical clearance to jail. Pt consents to draw blood. Pt denies pain/symptoms of any kind.

## 2020-03-06 ENCOUNTER — Other Ambulatory Visit: Payer: Self-pay

## 2020-03-06 ENCOUNTER — Encounter: Payer: Self-pay | Admitting: Emergency Medicine

## 2020-03-06 ENCOUNTER — Emergency Department
Admission: EM | Admit: 2020-03-06 | Discharge: 2020-03-06 | Disposition: A | Discharge status: Home / Self Care | Payer: HRSA Program | Attending: Emergency Medicine | Admitting: Emergency Medicine

## 2020-03-06 ENCOUNTER — Emergency Department: Payer: HRSA Program

## 2020-03-06 DIAGNOSIS — J45909 Unspecified asthma, uncomplicated: Secondary | ICD-10-CM | POA: Insufficient documentation

## 2020-03-06 DIAGNOSIS — R0781 Pleurodynia: Secondary | ICD-10-CM | POA: Diagnosis not present

## 2020-03-06 DIAGNOSIS — R0602 Shortness of breath: Secondary | ICD-10-CM | POA: Diagnosis present

## 2020-03-06 DIAGNOSIS — U071 COVID-19: Secondary | ICD-10-CM | POA: Diagnosis not present

## 2020-03-06 MED ORDER — METHYLPREDNISOLONE 4 MG PO TBPK: ORAL_TABLET | ORAL | 0 refills | Status: AC

## 2020-03-06 NOTE — ED Notes (Signed)
Says he has had a productive cough.  Last night started having pain to left lower chest that increasing with movement, deep breaths.  No distress.

## 2020-03-06 NOTE — ED Triage Notes (Signed)
Pt comes into the ED via POV c/o increased SHOB and pain under the left rib when he takes a deep breath/  Pt states he took an at-home COVID test that came back positive.  Pt states his symptoms started last Thursday.  Pt has even and unlabored respirations at this time.  Denies any CP or dizziness.

## 2020-03-06 NOTE — ED Provider Notes (Signed)
[facility] Emergency Department Provider Note   ____________________________________________   First MD Initiated Contact with Patient 03/06/20 1211     (approximate)  I have reviewed the triage vital signs and the nursing notes.   HISTORY  Chief Complaint Shortness of Breath    HPI Eric Odonnell is a 24 y.o. male patient presents with shortness of breath and pain in the left rib.  Patient state today had a positive at home COVID-19 test.  Patient states symptoms started 6 days ago.         Past Medical History:  Diagnosis Date  . Asthma     There are no problems to display for this patient.   History reviewed. No pertinent surgical history.  Prior to Admission medications   Medication Sig Start Date End Date Taking? Authorizing Provider  ketorolac (TORADOL) 10 MG tablet Take 1 tablet (10 mg total) by mouth every 6 (six) hours as needed for moderate pain. 09/07/19   Sharman Cheek, MD  loperamide (IMODIUM A-D) 2 MG tablet Take 1 tablet (2 mg total) by mouth 4 (four) times daily as needed for diarrhea or loose stools. 04/01/19   Darci Current, MD  methylPREDNISolone (MEDROL DOSEPAK) 4 MG TBPK tablet Take Tapered dose as directed 03/06/20   Joni Reining, PA-C  naproxen (NAPROSYN) 500 MG tablet Take 1 tablet (500 mg total) by mouth 2 (two) times daily with a meal. 02/02/19   Bridget Hartshorn L, PA-C  ondansetron (ZOFRAN ODT) 4 MG disintegrating tablet Take 1 tablet (4 mg total) by mouth every 8 (eight) hours as needed for nausea or vomiting. 09/07/19   Sharman Cheek, MD    Allergies Patient has no known allergies.  History reviewed. No pertinent family history.  Social History Social History   Tobacco Use  . Smoking status: Never Smoker  . Smokeless tobacco: Current User    Types: Chew  Substance Use Topics  . Alcohol use: No  . Drug use: No    Review of Systems Constitutional: No fever/chills Eyes: No visual  changes. ENT: No sore throat. Cardiovascular: Denies chest pain. Respiratory:  shortness of breath. Gastrointestinal: No abdominal pain.  No nausea, no vomiting.  No diarrhea.  No constipation. Genitourinary: Negative for dysuria. Musculoskeletal: Negative for back pain. Skin: Negative for rash. Neurological: Negative for headaches, focal weakness or numbness.   ____________________________________________   PHYSICAL EXAM:  VITAL SIGNS: ED Triage Vitals [03/06/20 1156]  Enc Vitals Group     BP 126/68     Pulse Rate 63     Resp 17     Temp 98.7 F (37.1 C)     Temp Source Oral     SpO2 98 %     Weight 172 lb (78 kg)     Height 6\' 1"  (1.854 m)     Head Circumference      Peak Flow      Pain Score 7     Pain Loc      Pain Edu?      Excl. in GC?    Constitutional: Alert and oriented. Well appearing and in no acute distress. Nose: No congestion/rhinnorhea. Mouth/Throat: Mucous membranes are moist.  Oropharynx non-erythematous. Neck: No stridor. Hematological/Lymphatic/Immunilogical: No cervical lymphadenopathy. Cardiovascular: Normal rate, regular rhythm. Grossly normal heart sounds.  Good peripheral circulation. Respiratory: Normal respiratory effort.  No retractions. Lungs CTAB. Gastrointestinal: Soft and nontender. No distention. No abdominal bruits. No CVA tenderness. Neurologic:  Normal speech and language. No  gross focal neurologic deficits are appreciated. No gait instability. Skin:  Skin is warm, dry and intact. No rash noted. Psychiatric: Mood and affect are normal. Speech and behavior are normal.  ____________________________________________   LABS (all labs ordered are listed, but only abnormal results are displayed)  Labs Reviewed - No data to display ____________________________________________  EKG   ____________________________________________  RADIOLOGY I, Joni Reining, personally viewed and evaluated these images (plain radiographs) as part of  my medical decision making, as well as reviewing the written report by the radiologist.  ED MD interpretation:    Official radiology report(s): DG Chest 2 View  Result Date: 03/06/2020 CLINICAL DATA:  Shortness of breath and left-sided chest pain with deep inspiration. Coronavirus infection suspected. EXAM: CHEST - 2 VIEW COMPARISON:  08/22/2012 FINDINGS: Heart size is normal. Mediastinal shadows are normal. The lungs are clear. No bronchial thickening. No infiltrate, mass, effusion or collapse. Pulmonary vascularity is normal. No bony abnormality. IMPRESSION: Normal chest. Electronically Signed   By: Paulina Fusi M.D.   On: 03/06/2020 12:50    ____________________________________________   PROCEDURES  Procedure(s) performed (including Critical Care):  Procedures   ____________________________________________   INITIAL IMPRESSION / ASSESSMENT AND PLAN / ED COURSE  As part of my medical decision making, I reviewed the following data within the electronic MEDICAL RECORD NUMBER         Patient presents with mild dyspnea.  Patient state had a positive home test for COVID-19.  Patient given discharge care instructions and a prescription for Medrol Dosepak.  Advised supportive care but return to ED if condition worsens.      ____________________________________________   FINAL CLINICAL IMPRESSION(S) / ED DIAGNOSES  Final diagnoses:  COVID-19     ED Discharge Orders         Ordered    methylPREDNISolone (MEDROL DOSEPAK) 4 MG TBPK tablet        03/06/20 1359          *Please note:  Eric Odonnell was evaluated in Emergency Department on 03/06/2020 for the symptoms described in the history of present illness. He was evaluated in the context of the global COVID-19 pandemic, which necessitated consideration that the patient might be at risk for infection with the SARS-CoV-2 virus that causes COVID-19. Institutional protocols and algorithms that pertain to the evaluation of  patients at risk for COVID-19 are in a state of rapid change based on information released by regulatory bodies including the CDC and federal and state organizations. These policies and algorithms were followed during the patient's care in the ED.  Some ED evaluations and interventions may be delayed as a result of limited staffing during and the pandemic.*   Note:  This document was prepared using Dragon voice recognition software and may include unintentional dictation errors.    Joni Reining, PA-C 03/06/20 1406    Merwyn Katos, MD 03/06/20 [phone]

## 2020-03-06 NOTE — Discharge Instructions (Addendum)
Follow discharge care instruction. 

## 2020-09-23 ENCOUNTER — Encounter: Payer: Self-pay | Admitting: Emergency Medicine

## 2020-09-23 ENCOUNTER — Emergency Department: Payer: Self-pay

## 2020-09-23 ENCOUNTER — Emergency Department
Admission: EM | Admit: 2020-09-23 | Discharge: 2020-09-23 | Disposition: A | Discharge status: Home / Self Care | Payer: Self-pay | Attending: Emergency Medicine | Admitting: Emergency Medicine

## 2020-09-23 ENCOUNTER — Other Ambulatory Visit: Payer: Self-pay

## 2020-09-23 DIAGNOSIS — J45909 Unspecified asthma, uncomplicated: Secondary | ICD-10-CM | POA: Insufficient documentation

## 2020-09-23 DIAGNOSIS — N200 Calculus of kidney: Secondary | ICD-10-CM | POA: Insufficient documentation

## 2020-09-23 DIAGNOSIS — F17220 Nicotine dependence, chewing tobacco, uncomplicated: Secondary | ICD-10-CM | POA: Insufficient documentation

## 2020-09-23 LAB — URINALYSIS, COMPLETE (UACMP) WITH MICROSCOPIC
Bilirubin Urine: NEGATIVE
Glucose, UA: NEGATIVE mg/dL
Ketones, ur: NEGATIVE mg/dL
Leukocytes,Ua: NEGATIVE
Nitrite: NEGATIVE
Protein, ur: NEGATIVE mg/dL
RBC / HPF: >50 RBC/hpf — ABNORMAL HIGH (ref 0–5)
Specific Gravity, Urine: 1.021 (ref 1.005–1.030)
Squamous Epithelial / HPF: NONE SEEN (ref 0–5)
pH: 5 (ref 5.0–8.0)

## 2020-09-23 LAB — BASIC METABOLIC PANEL
Anion gap: 6 (ref 5–15)
BUN: 10 mg/dL (ref 6–20)
CO2: 29 mmol/L (ref 22–32)
Calcium: 9.1 mg/dL (ref 8.9–10.3)
Chloride: 103 mmol/L (ref 98–111)
Creatinine, Ser: 0.91 mg/dL (ref 0.61–1.24)
GFR, Estimated: >60 mL/min (ref >60)
Glucose, Bld: 97 mg/dL (ref 70–99)
Potassium: 3.6 mmol/L (ref 3.5–5.1)
Sodium: 138 mmol/L (ref 135–145)

## 2020-09-23 LAB — HEPATIC FUNCTION PANEL
ALT: 10 U/L (ref 0–44)
AST: 13 U/L — ABNORMAL LOW (ref 15–41)
Albumin: 4.4 g/dL (ref 3.5–5.0)
Alkaline Phosphatase: 59 U/L (ref 38–126)
Bilirubin, Direct: <0.1 mg/dL (ref 0.0–0.2)
Total Bilirubin: 0.8 mg/dL (ref 0.3–1.2)
Total Protein: 7.4 g/dL (ref 6.5–8.1)

## 2020-09-23 LAB — CBC
HCT: 41 % (ref 39.0–52.0)
Hemoglobin: 14.6 g/dL (ref 13.0–17.0)
MCH: 30.2 pg (ref 26.0–34.0)
MCHC: 35.6 g/dL (ref 30.0–36.0)
MCV: 84.7 fL (ref 80.0–100.0)
Platelets: 270 10*3/uL (ref 150–400)
RBC: 4.84 MIL/uL (ref 4.22–5.81)
RDW: 12.5 % (ref 11.5–15.5)
WBC: 6.4 10*3/uL (ref 4.0–10.5)
nRBC: 0 % (ref 0.0–0.2)

## 2020-09-23 LAB — LIPASE, BLOOD: Lipase: 49 U/L (ref 11–51)

## 2020-09-23 MED ORDER — ACETAMINOPHEN 500 MG PO TABS
1000.0000 mg | ORAL_TABLET | Freq: Once | ORAL | Status: AC
  Administered 2020-09-23: 12:00:00 1000 mg via ORAL
  Filled 2020-09-23: qty 2

## 2020-09-23 MED ORDER — KETOROLAC TROMETHAMINE 30 MG/ML IJ SOLN
30.0000 mg | Freq: Once | INTRAMUSCULAR | Status: AC
  Administered 2020-09-23: 12:00:00 30 mg via INTRAMUSCULAR
  Filled 2020-09-23: qty 1

## 2020-09-23 MED ORDER — TAMSULOSIN HCL 0.4 MG PO CAPS: 0.4000 mg | ORAL_CAPSULE | Freq: Every day | ORAL | 0 refills | Status: AC

## 2020-09-23 MED ORDER — OXYCODONE HCL 5 MG PO TABS
5.0000 mg | ORAL_TABLET | Freq: Three times a day (TID) | ORAL | 0 refills | Status: AC | PRN
Start: 2020-09-23 — End: 2020-09-25

## 2020-09-23 NOTE — ED Notes (Signed)
Pt at CT

## 2020-09-23 NOTE — ED Notes (Signed)
Pt presents to ED with c/o of R flank pain that started yesterday morning but states went away and then came back this morning. Pt states HX of kidney stones as well as diverticulitis. Pt denies fevers or chills. Pt denies issues with urinating at this time. Pt denies N/V/D. Pt is a&oX4.

## 2020-09-23 NOTE — ED Provider Notes (Signed)
[facility] Emergency Department Provider Note  ____________________________________________   Event Date/Time   First MD Initiated Contact with Patient 09/23/20 1121     (approximate)  I have reviewed the triage vital signs    HISTORY  Chief Complaint Flank Pain    HPI Eric Odonnell is a 25 y.o. male who presents with flank pain.  Patient reports having flank pain x1 day.  Patient states the pain started last night.  Right flank, radiating around to his abdomen, nothing makes it better, nothing makes it worse.  Reports a history of kidney stones .  Denies any pain with urination or blood in his urine.     Past Medical History:  Diagnosis Date   Asthma     There are no problems to display for this patient.   History reviewed. No pertinent surgical history.  Prior to Admission medications   Medication Sig Start Date End Date Taking? Authorizing Provider  ketorolac (TORADOL) 10 MG tablet Take 1 tablet (10 mg total) by mouth every 6 (six) hours as needed for moderate pain. 09/07/19   Sharman Cheek, MD  loperamide (IMODIUM A-D) 2 MG tablet Take 1 tablet (2 mg total) by mouth 4 (four) times daily as needed for diarrhea or loose stools. 04/01/19   Darci Current, MD  methylPREDNISolone (MEDROL DOSEPAK) 4 MG TBPK tablet Take Tapered dose as directed 03/06/20   Joni Reining, PA-C  naproxen (NAPROSYN) 500 MG tablet Take 1 tablet (500 mg total) by mouth 2 (two) times daily with a meal. 02/02/19   Bridget Hartshorn L, PA-C  ondansetron (ZOFRAN ODT) 4 MG disintegrating tablet Take 1 tablet (4 mg total) by mouth every 8 (eight) hours as needed for nausea or vomiting. 09/07/19   Sharman Cheek, MD    Allergies Patient has no known allergies.  No family history on file.  Social History Social History   Tobacco Use   Smoking status: Never   Smokeless tobacco: Current    Types: Chew  Substance Use Topics   Alcohol use: No   Drug use: No       Review of Systems Constitutional: No fever/chills Eyes: No visual changes. ENT: No sore throat. Cardiovascular: Denies chest pain. Respiratory: no SOB. No cough Gastrointestinal: No abdominal pain.  No nausea, no vomiting.  No diarrhea.  No constipation. Genitourinary: no severe blood in urine Musculoskeletal: + flank pain Skin: Negative for rash. Neurological: Negative for headaches, focal weakness or numbness. All other ROS negative ____________________________________________   PHYSICAL EXAM:  VITAL SIGNS: ED Triage Vitals [09/23/20 1016]  Enc Vitals Group     BP 124/70     Pulse Rate (!) 50     Resp 16     Temp 98.4 F (36.9 C)     Temp Source Oral     SpO2 100 %     Weight 171 lb 15.3 oz (78 kg)     Height 6\' 1"  (1.854 m)     Head Circumference      Peak Flow      Pain Score 8     Pain Loc      Pain Edu?      Excl. in GC?     Constitutional: Alert and oriented. Discomfort noted  Eyes: Conjunctivae are normal. EOMI. Head: Atraumatic. Nose: No congestion/rhinnorhea. Mouth/Throat: Mucous membranes are moist.   Neck: No stridor. Trachea Midline. FROM Cardiovascular: Normal rate, regular rhythm.  Good peripheral circulation. Respiratory: no audible stridor, work  of breathing  Gastrointestinal: Soft and nontender. No distention.  Musculoskeletal: No lower extremity tenderness nor edema.  No joint effusions. Neurologic:  Normal speech and language. No gross focal neurologic deficits are appreciated.  Skin:  Skin is warm, dry and intact. No rash noted. Psychiatric: Mood and affect are normal. Speech and behavior are normal. GU: Deferred  Flank tenderness.  ____________________________________________   LABS (all labs ordered are listed, but only abnormal results are displayed)  Labs Reviewed  URINALYSIS, COMPLETE (UACMP) WITH MICROSCOPIC - Abnormal; Notable for the following components:      Result Value   Color, Urine YELLOW (*)    APPearance HAZY  (*)    Hgb urine dipstick LARGE (*)    RBC / HPF >50 (*)    Bacteria, UA RARE (*)    All other components within normal limits  BASIC METABOLIC PANEL  CBC   ____________________________________________  RADIOLOGY   Official radiology report(s): CT Renal Stone Study  Result Date: 09/23/2020 CLINICAL DATA:  Flank pain, renal stone suspected. EXAM: CT ABDOMEN AND PELVIS WITHOUT CONTRAST TECHNIQUE: Multidetector CT imaging of the abdomen and pelvis was performed following the standard protocol without IV contrast. COMPARISON:  CT September 07, 2019 FINDINGS: Lower chest: No acute abnormality. Normal size heart. No significant pericardial effusion/thickening. Hepatobiliary: Unremarkable noncontrast appearance of the hepatic parenchyma. Gallbladder is unremarkable. No biliary ductal dilation. Pancreas: Within normal limits. Spleen: Within normal limits. Adrenals/Urinary Tract: Bilateral adrenal glands are unremarkable. No hydronephrosis. No renal or ureteral calculi visualized. Punctate dependent calcification in the bladder measuring 1-2 mm. No contour deforming renal masses. Stomach/Bowel: Stomach is within normal limits. Appendix appears normal. No evidence of bowel wall thickening, distention, or inflammatory changes. Colonic diverticulosis without findings of acute diverticulitis. Vascular/Lymphatic: No abdominal aortic aneurysm. No pathologically enlarged abdominal or pelvic lymph nodes. Reproductive: Prostate is unremarkable. Other: No abdominopelvic ascites. Musculoskeletal: Bone island in the left femur at the lesser trochanter and right femoral neck. No acute osseous abnormality. IMPRESSION: 1. Punctate 1-2 mm dependent calcification in the bladder, which given patient history likely represents a recently passed stone. No renal or ureteral calculi visualized. No hydronephrosis. 2. Colonic diverticulosis without findings of acute diverticulitis. Electronically Signed   By: Maudry Mayhew MD   On:  09/23/2020 12:18    ____________________________________________   PROCEDURES  Procedure(s) performed (including Critical Care):  Procedures   ____________________________________________   INITIAL IMPRESSION / ASSESSMENT AND PLAN / ED COURSE  Eric Odonnell was evaluated in Emergency Department on 09/23/2020 for the symptoms described in the history of present illness. He was evaluated in the context of the global COVID-19 pandemic, which necessitated consideration that the patient might be at risk for infection with the SARS-CoV-2 virus that causes COVID-19. Institutional protocols and algorithms that pertain to the evaluation of patients at risk for COVID-19 are in a state of rapid change based on information released by regulatory bodies including the CDC and federal and state organizations. These policies and algorithms were followed during the patient's care in the ED.     Pt presents with flank pain.  Suspect this is most likely kidney stone. Will get labs to evaluate for electrolyte abnormalities/AKI.  CT scan ordered to evaluate for kidney stone.  Also consider appendicitis vs SBO although less likely given description of pain.  Will get UA to evaluate for UTI/pyelo.   CT confirms kidney stone in the bladder.  Urine does have RBCs in it suspect that he did pass this.  No  evidence of infected stone. No fevers. UA re-assuring.   Reviewed labs--No severe AKI.   Patient still having some pain I suspect it could just be from some spasming of the ureter.  There is no evidence of gallstones and appendix was normal on CT  D/w pt rx with ibuprofen/tylenol and oxycodone for breath though pain.  Understands not to drive or work while on oxycodone.  Given flomax and urology number for follow up.  Pt expressed understanding and felt comfortable with dc home.   I discussed the provisional nature of ED diagnosis, the treatment so far, the ongoing plan of care, follow up appointments and  return precautions with the patient and any family or support people present. They expressed understanding and agreed with the plan, discharged home.        ____________________________________________   FINAL CLINICAL IMPRESSION(S) / ED DIAGNOSES   Final diagnoses:  Kidney stone      MEDICATIONS GIVEN DURING THIS VISIT:  Medications  ketorolac (TORADOL) 30 MG/ML injection 30 mg (30 mg Intramuscular Given 09/23/20 1228)  acetaminophen (TYLENOL) tablet 1,000 mg (1,000 mg Oral Given 09/23/20 1228)     ED Discharge Orders          Ordered    tamsulosin (FLOMAX) 0.4 MG CAPS capsule  Daily        09/23/20 1330    oxyCODONE (ROXICODONE) 5 MG immediate release tablet  Every 8 hours PRN        09/23/20 1330             Note:  This document was prepared using Dragon voice recognition software and may include unintentional dictation errors.   Concha Se, MD 09/23/20 1332

## 2020-09-23 NOTE — Discharge Instructions (Addendum)
You have a kidney stone. See report below.   Take ibuprofen 600mg  every 8 hours daily (as long as you are not on any other blood thinners or have kidney disease) Take tylenol 1g every 8 hours daily. Take oxycodone for breakthrough pain. Do not drive, work, or operate machinery while on this.  Take Flomax to help dilate uretha. Call urology number above to schedule outpatient appointment. Return to ED for fevers, unable to keep food down, or any other concerns.     IMPRESSION: 1. Punctate 1-2 mm dependent calcification in the bladder, which given patient history likely represents a recently passed stone. No renal or ureteral calculi visualized. No hydronephrosis. 2. Colonic diverticulosis without findings of acute diverticulitis.  Take oxycodone as prescribed. Do not drink alcohol, drive or participate in any other potentially dangerous activities while taking this medication as it may make you sleepy. Do not take this medication with any other sedating medications, either prescription or over-the-counter. If you were prescribed Percocet or Vicodin, do not take these with acetaminophen (Tylenol) as it is already contained within these medications.  This medication is an opiate (or narcotic) pain medication and can be habit forming. Use it as little as possible to achieve adequate pain control. Do not use or use it with extreme caution if you have a history of opiate abuse or dependence. If you are on a pain contract with your primary care doctor or a pain specialist, be sure to let them know you were prescribed this medication today from the Emergency Department. This medication is intended for your use only - do not give any to anyone else and keep it in a secure place where nobody else, especially children, have access to it.

## 2020-09-23 NOTE — ED Notes (Signed)
Lab called to add specimens onto existing blood already in lab.

## 2020-09-23 NOTE — ED Triage Notes (Signed)
C/O right flank pain x 1 day.  AAOx3.  Skin warm and dry. NAD

## 2020-10-01 ENCOUNTER — Telehealth: Payer: Self-pay | Admitting: *Deleted

## 2020-10-01 NOTE — Telephone Encounter (Signed)
Notified patient as instructed, patient pleased. Discussed follow-up appointments, patient agrees  

## 2020-10-01 NOTE — Telephone Encounter (Signed)
-----   Message from Riki Altes, MD sent at 09/30/2020  4:35 PM EDT ----- Regarding: ED visit Recent ED visit for kidney stone.  Please schedule follow-up appointment with KUB prior

## 2020-10-16 ENCOUNTER — Ambulatory Visit
Admission: RE | Admit: 2020-10-16 | Discharge: 2020-10-16 | Disposition: A | Discharge status: Home / Self Care | Payer: Self-pay | Source: Ambulatory Visit | Attending: Urology | Admitting: Urology

## 2020-10-16 ENCOUNTER — Encounter: Payer: Self-pay | Admitting: Urology

## 2020-10-16 ENCOUNTER — Ambulatory Visit
Admission: RE | Admit: 2020-10-16 | Discharge: 2020-10-16 | Disposition: A | Discharge status: Home / Self Care | Payer: Self-pay | Attending: Urology | Admitting: Urology

## 2020-10-16 ENCOUNTER — Other Ambulatory Visit: Payer: Self-pay

## 2020-10-16 ENCOUNTER — Ambulatory Visit (INDEPENDENT_AMBULATORY_CARE_PROVIDER_SITE_OTHER): Payer: Self-pay | Admitting: Urology

## 2020-10-16 VITALS — BP 119/75 | HR 56 | Ht 73.0 in | Wt 164.0 lb

## 2020-10-16 DIAGNOSIS — N200 Calculus of kidney: Secondary | ICD-10-CM

## 2020-10-16 NOTE — Progress Notes (Signed)
10/16/2020 1:01 PM   Eric Odonnell 23-Jun-1995 [id number]  Referring provider: No referring provider defined for this encounter.  Chief Complaint  Patient presents with   Nephrolithiasis    HPI: Eric Odonnell is a 25 y.o. male who presents for follow-up of recent ED visit for renal colic.  Seen [facility] ED 09/23/2020 with a 1 day history of right flank pain radiating to the right lower quadrant No precipitating, aggravating or alleviating factors No fever, chills, nausea, vomiting UA with >50 RBCs Stone protocol CT remarkable for a 1-2 mm bladder calcification felt consistent with a recently passed stone No hydronephrosis noted Discharged on tamsulosin and oxycodone No previous history stone disease Since ED visit he has been asymptomatic and no recurrent pain States 1 other prior episode of renal colic June 2021.  CT showed a 2 mm left distal ureteral calculus and no renal calculi   PMH: Past Medical History:  Diagnosis Date   Asthma     Surgical History: No past surgical history on file.  Home Medications:  Allergies as of 10/16/2020   No Known Allergies      Medication List        Accurate as of October 16, 2020  1:01 PM. If you have any questions, ask your nurse or doctor.          STOP taking these medications    ketorolac 10 MG tablet Commonly known as: TORADOL Stopped by: Riki Altes, MD       TAKE these medications    loperamide 2 MG tablet Commonly known as: Imodium A-D Take 1 tablet (2 mg total) by mouth 4 (four) times daily as needed for diarrhea or loose stools.   methylPREDNISolone 4 MG Tbpk tablet Commonly known as: MEDROL DOSEPAK Take Tapered dose as directed   naproxen 500 MG tablet Commonly known as: Naprosyn Take 1 tablet (500 mg total) by mouth 2 (two) times daily with a meal.   ondansetron 4 MG disintegrating tablet Commonly known as: Zofran ODT Take 1 tablet (4 mg total) by mouth every 8 (eight) hours as needed for nausea  or vomiting.        Allergies: No Known Allergies  Family History: No family history on file.  Social History:  reports that he has never smoked. His smokeless tobacco use includes chew. He reports that he does not drink alcohol and does not use drugs.   Physical Exam: BP 119/75   Pulse (!) 56   Ht 6\' 1"  (1.854 m)   Wt 164 lb (74.4 kg)   BMI 21.64 kg/m   Constitutional:  Alert and oriented, No acute distress. HEENT: [place] AT, moist mucus membranes.  Trachea midline, no masses. Cardiovascular: No clubbing, cyanosis, or edema. Respiratory: Normal respiratory effort, no increased work of breathing. Neurologic: Grossly intact, no focal deficits, moving all 4 extremities. Psychiatric: Normal mood and affect.   Pertinent Imaging: CT images were personally reviewed and interpreted  CT Renal Stone Study  Narrative CLINICAL DATA:  Flank pain, renal stone suspected.  EXAM: CT ABDOMEN AND PELVIS WITHOUT CONTRAST  TECHNIQUE: Multidetector CT imaging of the abdomen and pelvis was performed following the standard protocol without IV contrast.  COMPARISON:  CT September 07, 2019  FINDINGS: Lower chest: No acute abnormality. Normal size heart. No significant pericardial effusion/thickening.  Hepatobiliary: Unremarkable noncontrast appearance of the hepatic parenchyma. Gallbladder is unremarkable. No biliary ductal dilation.  Pancreas: Within normal limits.  Spleen: Within normal limits.  Adrenals/Urinary Tract: Bilateral adrenal  glands are unremarkable.  No hydronephrosis. No renal or ureteral calculi visualized. Punctate dependent calcification in the bladder measuring 1-2 mm. No contour deforming renal masses.  Stomach/Bowel: Stomach is within normal limits. Appendix appears normal. No evidence of bowel wall thickening, distention, or inflammatory changes. Colonic diverticulosis without findings of acute diverticulitis.  Vascular/Lymphatic: No abdominal aortic aneurysm.  No pathologically enlarged abdominal or pelvic lymph nodes.  Reproductive: Prostate is unremarkable.  Other: No abdominopelvic ascites.  Musculoskeletal: Bone island in the left femur at the lesser trochanter and right femoral neck. No acute osseous abnormality.  IMPRESSION: 1. Punctate 1-2 mm dependent calcification in the bladder, which given patient history likely represents a recently passed stone. No renal or ureteral calculi visualized. No hydronephrosis. 2. Colonic diverticulosis without findings of acute diverticulitis.   Electronically Signed By: Maudry Mayhew MD On: 09/23/2020 12:18   Assessment & Plan:    1.  Recurrent nephrolithiasis KUB today reviewed and no abnormal calcifications identified CT was consistent with a past calculus We discussed based on his age and 2 separate stone episodes in approximately 1 year that he is likely to form more urinary tract calculi Recommended a metabolic evaluation to include 24-hour urine study and blood work He is in the process of getting insurance and will wait until this occurs Once he does study he will be notified with the results 1 year office visit with KUB was scheduled   Riki Altes, MD  [facility] [address], [address] [place], [place] [address] [phone]

## 2020-10-16 NOTE — Patient Instructions (Signed)
[facility] Instructions [facility]   You will receive a box/kit in the mail that will have a urine jug and instructions in the kit.  When the box arrives you will need to call our office [phone] to schedule a LAB appointment.   You will need to do a 24hour urine and this should be done during the days that our office will be open.  For example any day from Sunday through Thursday.   If you take Vitamin C 161m or greater please stop this 5 days prior to collection.   How to collect the urine sample: On the day you start the urine sample this 1st morning urine should NOT be collected.  For the rest of the day including all night urines should be collected.  On the next morning the 1st urine should be collected and then you will be finished with the urine collections.   You will need to bring the box with you on your LAB appointment day after urine has been collected and all instructions are complete in the box.  Your blood will be drawn and the box will be collected by our Lab employee to be sent off for analysis.   When urine and blood is complete you will need to schedule a follow up appointment for lab results.

## 2021-01-20 ENCOUNTER — Other Ambulatory Visit: Payer: Self-pay

## 2021-01-20 ENCOUNTER — Emergency Department: Payer: Self-pay

## 2021-01-20 ENCOUNTER — Emergency Department
Admission: EM | Admit: 2021-01-20 | Discharge: 2021-01-20 | Disposition: A | Discharge status: Home / Self Care | Payer: Self-pay | Attending: Emergency Medicine | Admitting: Emergency Medicine

## 2021-01-20 DIAGNOSIS — J45909 Unspecified asthma, uncomplicated: Secondary | ICD-10-CM | POA: Insufficient documentation

## 2021-01-20 DIAGNOSIS — F17220 Nicotine dependence, chewing tobacco, uncomplicated: Secondary | ICD-10-CM | POA: Insufficient documentation

## 2021-01-20 DIAGNOSIS — J029 Acute pharyngitis, unspecified: Secondary | ICD-10-CM | POA: Insufficient documentation

## 2021-01-20 LAB — COMPREHENSIVE METABOLIC PANEL
ALT: 9 U/L (ref 0–44)
AST: 14 U/L — ABNORMAL LOW (ref 15–41)
Albumin: 4.1 g/dL (ref 3.5–5.0)
Alkaline Phosphatase: 70 U/L (ref 38–126)
Anion gap: 9 (ref 5–15)
BUN: 8 mg/dL (ref 6–20)
CO2: 27 mmol/L (ref 22–32)
Calcium: 9 mg/dL (ref 8.9–10.3)
Chloride: 99 mmol/L (ref 98–111)
Creatinine, Ser: 0.77 mg/dL (ref 0.61–1.24)
GFR, Estimated: >60 mL/min (ref >60)
Glucose, Bld: 94 mg/dL (ref 70–99)
Potassium: 3.8 mmol/L (ref 3.5–5.1)
Sodium: 135 mmol/L (ref 135–145)
Total Bilirubin: 0.7 mg/dL (ref 0.3–1.2)
Total Protein: 7.7 g/dL (ref 6.5–8.1)

## 2021-01-20 LAB — CBC WITH DIFFERENTIAL/PLATELET
Abs Immature Granulocytes: 0.02 10*3/uL (ref 0.00–0.07)
Basophils Absolute: 0.1 10*3/uL (ref 0.0–0.1)
Basophils Relative: 1 %
Eosinophils Absolute: 0.2 10*3/uL (ref 0.0–0.5)
Eosinophils Relative: 2 %
HCT: 38.9 % — ABNORMAL LOW (ref 39.0–52.0)
Hemoglobin: 13.5 g/dL (ref 13.0–17.0)
Immature Granulocytes: 0 %
Lymphocytes Relative: 19 %
Lymphs Abs: 1.7 10*3/uL (ref 0.7–4.0)
MCH: 29.6 pg (ref 26.0–34.0)
MCHC: 34.7 g/dL (ref 30.0–36.0)
MCV: 85.3 fL (ref 80.0–100.0)
Monocytes Absolute: 0.8 10*3/uL (ref 0.1–1.0)
Monocytes Relative: 9 %
Neutro Abs: 6.3 10*3/uL (ref 1.7–7.7)
Neutrophils Relative %: 69 %
Platelets: 245 10*3/uL (ref 150–400)
RBC: 4.56 MIL/uL (ref 4.22–5.81)
RDW: 12.1 % (ref 11.5–15.5)
WBC: 9 10*3/uL (ref 4.0–10.5)
nRBC: 0 % (ref 0.0–0.2)

## 2021-01-20 LAB — GROUP A STREP BY PCR: Group A Strep by PCR: NOT DETECTED

## 2021-01-20 MED ORDER — IOHEXOL 300 MG/ML  SOLN
75.0000 mL | Freq: Once | INTRAMUSCULAR | Status: AC | PRN
  Administered 2021-01-20: 75 mL via INTRAVENOUS
  Filled 2021-01-20: qty 75

## 2021-01-20 MED ORDER — DEXAMETHASONE SODIUM PHOSPHATE 10 MG/ML IJ SOLN
10.0000 mg | Freq: Once | INTRAMUSCULAR | Status: AC
  Administered 2021-01-20: 10 mg via INTRAVENOUS
  Filled 2021-01-20: qty 1

## 2021-01-20 NOTE — ED Triage Notes (Signed)
Pt comes with c/o abscess noted to left side. Pt states pain to swallow. Pt states this started last week.

## 2021-01-20 NOTE — ED Provider Notes (Signed)
[facility]-EMERGENCY DEPARTMENT  ____________________________________________  Time seen: Approximately 7:22 PM  I have reviewed the triage vital signs and the nursing notes.   HISTORY  Chief Complaint Abscess   Historian Patient     HPI Eric Odonnell is a 25 y.o. male presents to the emergency department with pharyngitis for the past week.  Patient states it has been difficult for him to swallow and he has concerned that he has an abscess in his left tonsil.  He is speaking in complete sentences and maintaining his own secretions.  No prior history of retropharyngeal or peritonsillar abscess.  Patient states that he does chew tobacco.  No chest pain, chest tightness or abdominal pain.   Past Medical History:  Diagnosis Date   Asthma      Immunizations up to date:  Yes.     Past Medical History:  Diagnosis Date   Asthma     There are no problems to display for this patient.   History reviewed. No pertinent surgical history.  Prior to Admission medications   Medication Sig Start Date End Date Taking? Authorizing Provider  loperamide (IMODIUM A-D) 2 MG tablet Take 1 tablet (2 mg total) by mouth 4 (four) times daily as needed for diarrhea or loose stools. 04/01/19   Darci Current, MD  methylPREDNISolone (MEDROL DOSEPAK) 4 MG TBPK tablet Take Tapered dose as directed 03/06/20   Joni Reining, PA-C  naproxen (NAPROSYN) 500 MG tablet Take 1 tablet (500 mg total) by mouth 2 (two) times daily with a meal. 02/02/19   Bridget Hartshorn L, PA-C  ondansetron (ZOFRAN ODT) 4 MG disintegrating tablet Take 1 tablet (4 mg total) by mouth every 8 (eight) hours as needed for nausea or vomiting. 09/07/19   Sharman Cheek, MD    Allergies Patient has no known allergies.  No family history on file.  Social History Social History   Tobacco Use   Smoking status: Never   Smokeless tobacco: Current    Types: Chew  Substance Use Topics   Alcohol use: No   Drug use: No      Review of Systems  Constitutional: No fever/chills Eyes:  No discharge ENT: Patient has pharyngitis.  Respiratory: no cough. No SOB/ use of accessory muscles to breath Gastrointestinal:   No nausea, no vomiting.  No diarrhea.  No constipation. Musculoskeletal: Negative for musculoskeletal pain. Skin: Negative for rash, abrasions, lacerations, ecchymosis.   ____________________________________________   PHYSICAL EXAM:  VITAL SIGNS: ED Triage Vitals  Enc Vitals Group     BP 01/20/21 1741 119/65     Pulse Rate 01/20/21 1741 (!) 48     Resp 01/20/21 1741 17     Temp 01/20/21 1741 98.3 F (36.8 C)     Temp Source 01/20/21 1741 Oral     SpO2 01/20/21 1741 100 %     Weight --      Height --      Head Circumference --      Peak Flow --      Pain Score 01/20/21 1739 8     Pain Loc --      Pain Edu? --      Excl. in GC? --      Constitutional: Alert and oriented. Well appearing and in no acute distress. Eyes: Conjunctivae are normal. PERRL. EOMI. Head: Atraumatic. ENT:      Nose: No congestion/rhinnorhea.      Mouth/Throat: Mucous membranes are moist.  Posterior pharynx is erythematous and left tonsil  does appear larger than right.  Uvula is midline Neck: No stridor.  No cervical spine tenderness to palpation. Cardiovascular: Normal rate, regular rhythm. Normal S1 and S2.  Good peripheral circulation. Respiratory: Normal respiratory effort without tachypnea or retractions. Lungs CTAB. Good air entry to the bases with no decreased or absent breath sounds Gastrointestinal: Bowel sounds x 4 quadrants. Soft and nontender to palpation. No guarding or rigidity. No distention. Musculoskeletal: Full range of motion to all extremities. No obvious deformities noted Neurologic:  Normal for age. No gross focal neurologic deficits are appreciated.  Skin:  Skin is warm, dry and intact. No rash noted. Psychiatric: Mood and affect are normal for age. Speech and behavior are normal.    ____________________________________________   LABS (all labs ordered are listed, but only abnormal results are displayed)  Labs Reviewed  CBC WITH DIFFERENTIAL/PLATELET - Abnormal; Notable for the following components:      Result Value   HCT 38.9 (*)    All other components within normal limits  COMPREHENSIVE METABOLIC PANEL - Abnormal; Notable for the following components:   AST 14 (*)    All other components within normal limits  GROUP A STREP BY PCR   ____________________________________________  EKG   ____________________________________________  RADIOLOGY Geraldo Pitter, personally viewed and evaluated these images (plain radiographs) as part of my medical decision making, as well as reviewing the written report by the radiologist.    CT Soft Tissue Neck W Contrast  Result Date: 01/20/2021 CLINICAL DATA:  Initial evaluation for acute sore throat/odynophagia, left. EXAM: CT NECK WITH CONTRAST TECHNIQUE: Multidetector CT imaging of the neck was performed using the standard protocol following the bolus administration of intravenous contrast. CONTRAST:  49mL OMNIPAQUE IOHEXOL 300 MG/ML  SOLN COMPARISON:  None available. FINDINGS: Pharynx and larynx: Oral cavity within normal limits. No acute inflammatory changes seen about the dentition. Slight asymmetric prominence of the left palatine tonsil as compared to the right without discrete tonsillar or peritonsillar abscess. Additionally, there is question of mild mucosal edema involving the a adjacent left oropharynx (series 2, image 50). Given patient history, finding suggestive of mild left-sided tonsillitis/pharyngitis. No retropharyngeal swelling or collection. Epiglottis itself is normal. Vallecula clear. Remainder of the hypopharynx and supraglottic larynx within normal limits. Vocal cords symmetric and normal. Trace layering secretions noted within the proximal subglottic airway. Salivary glands: Salivary glands including  the parotid and submandibular glands are within normal limits. Thyroid: Normal. Lymph nodes: Mildly prominent left level II lymph nodes measure up to 1.2 cm in short axis, nonspecific, but could be reactive. No other enlarged or pathologic adenopathy within the neck. Vascular: Normal intravascular enhancement seen throughout the neck. Limited intracranial: Unremarkable. Visualized orbits: Unremarkable. Mastoids and visualized paranasal sinuses: Visualized paranasal sinuses are clear. Mastoid air cells and middle ear cavities are well pneumatized and free of fluid. Skeleton: Unremarkable. Upper chest: Unremarkable. Other: None. IMPRESSION: 1. Slight asymmetric prominence of the left palatine tonsil as compared to the right with question of mild mucosal edema involving the adjacent left oropharynx. Given patient history, finding suggestive of mild left-sided tonsillitis/pharyngitis. No discrete tonsillar or peritonsillar abscess. 2. Mildly prominent left level II lymph nodes, nonspecific, but presumably reactive. Electronically Signed   By: Rise Mu M.D.   On: 01/20/2021 20:38    ____________________________________________    PROCEDURES  Procedure(s) performed:     Procedures     Medications  dexamethasone (DECADRON) injection 10 mg (10 mg Intravenous Given 01/20/21 1936)  iohexol (OMNIPAQUE) 300  MG/ML solution 75 mL (75 mLs Intravenous Contrast Given 01/20/21 2011)     ____________________________________________   INITIAL IMPRESSION / ASSESSMENT AND PLAN / ED COURSE  Pertinent labs & imaging results that were available during my care of the patient were reviewed by me and considered in my medical decision making (see chart for details).      Assessment and plan: Pharyngitis 25 year old male presents to the emergency department with pharyngitis for 1 week.  Patient was bradycardic at triage but vital signs were otherwise reassuring.  He had slightly muffled voice.  Left  tonsil did appear larger than right  CBC and CMP were reassuring.  Group A strep was negative.  CT soft tissue neck showed no evidence of peritonsillar retropharyngeal abscess.   Patient was advised to take Tylenol and ibuprofen alternating for discomfort at home.      ____________________________________________  FINAL CLINICAL IMPRESSION(S) / ED DIAGNOSES  Final diagnoses:  Pharyngitis, unspecified etiology      NEW MEDICATIONS STARTED DURING THIS VISIT:  ED Discharge Orders     None           This chart was dictated using voice recognition software/Dragon. Despite best efforts to proofread, errors can occur which can change the meaning. Any change was purely unintentional.     Orvil Feil, PA-C 01/20/21 2048    Willy Eddy, MD 01/21/21 [phone]

## 2021-10-16 ENCOUNTER — Ambulatory Visit: Payer: Self-pay | Admitting: Urology

## 2021-10-16 ENCOUNTER — Encounter: Payer: Self-pay | Admitting: Urology

## 2022-04-08 ENCOUNTER — Telehealth: Payer: Self-pay

## 2022-04-08 NOTE — Telephone Encounter (Signed)
Mychart msg sent. AS, CMA 

## 2023-02-01 DIAGNOSIS — S39012A Strain of muscle, fascia and tendon of lower back, initial encounter: Secondary | ICD-10-CM | POA: Diagnosis not present

## 2023-07-25 IMAGING — CT CT NECK W/ CM
3 of 4 series · 13 of 35 positions shown, 16 images · IV contrast (omnipaque)
Comparison: None available.

CLINICAL DATA: Initial evaluation for acute sore
throat/odynophagia, left.

EXAM:
CT NECK WITH CONTRAST
TECHNIQUE: Multidetector CT imaging of the neck was performed using the
standard protocol following the bolus administration of intravenous
contrast.
CONTRAST:  75mL OMNIPAQUE IOHEXOL 300 MG/ML  SOLN

[Series 2: axial neck · axial · 0.48mm/px · z∈[+5,+183]mm · 5 of 135 slices shown, 7 images]
[im 23/135  soft-tissue]
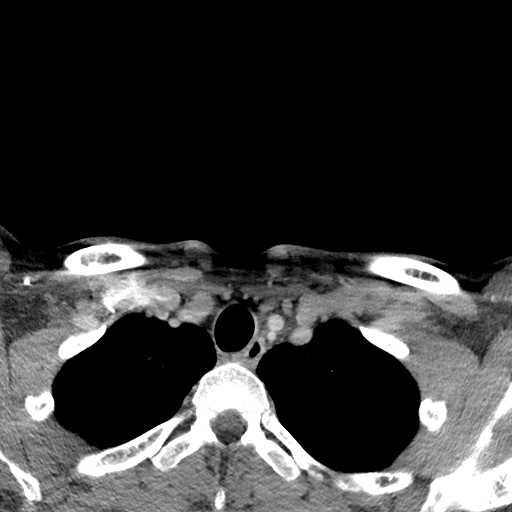
[im 23/135  bone]
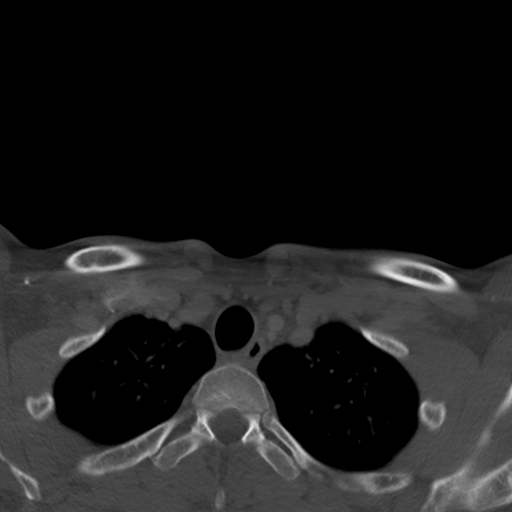
[im 45/135  bone]
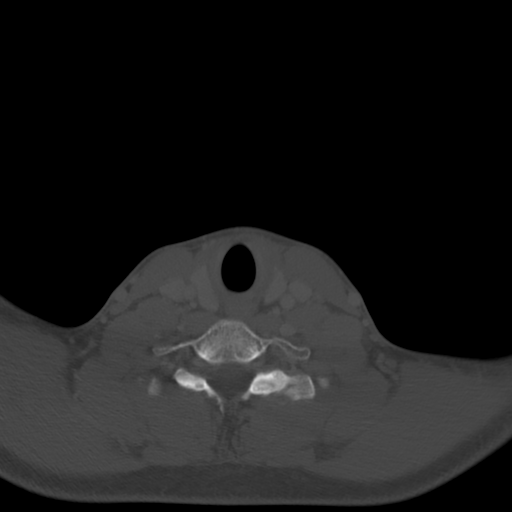
[im 68/135  bone]
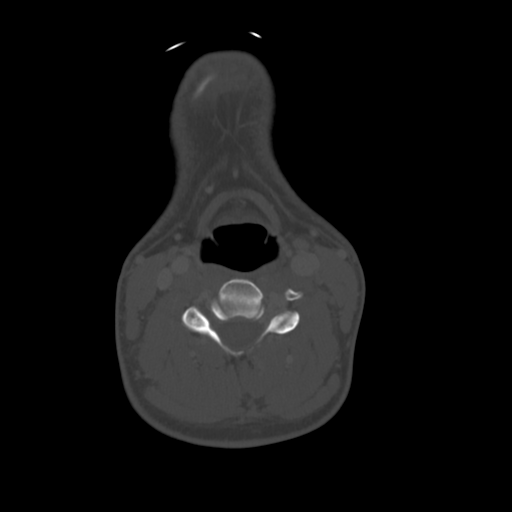
[im 90/135  bone]
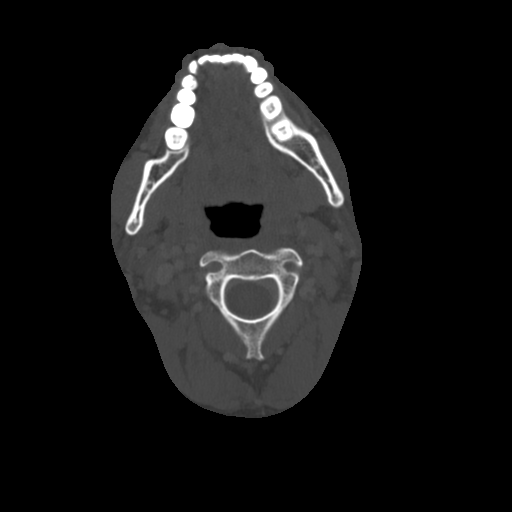
[im 112/135  soft-tissue]
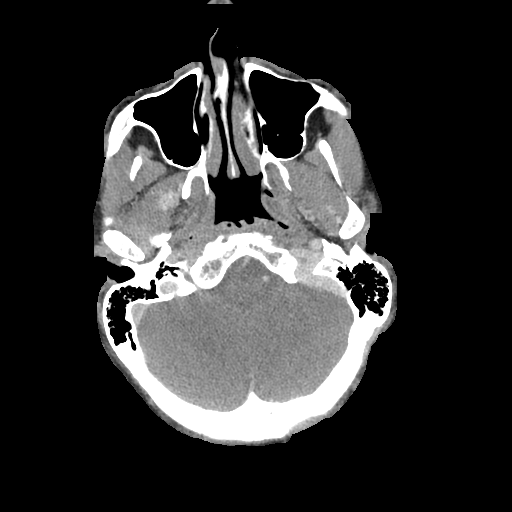
[im 112/135  bone]
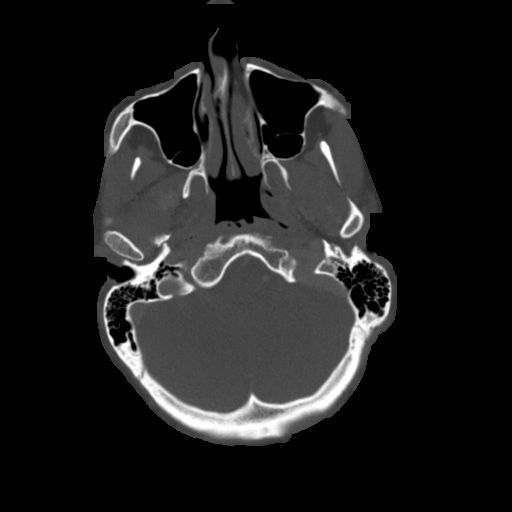

[Series 5: sag neck · sagittal · 0.51mm/px · 5 of 110 slices shown, 6 images]
[im 37/110  bone]
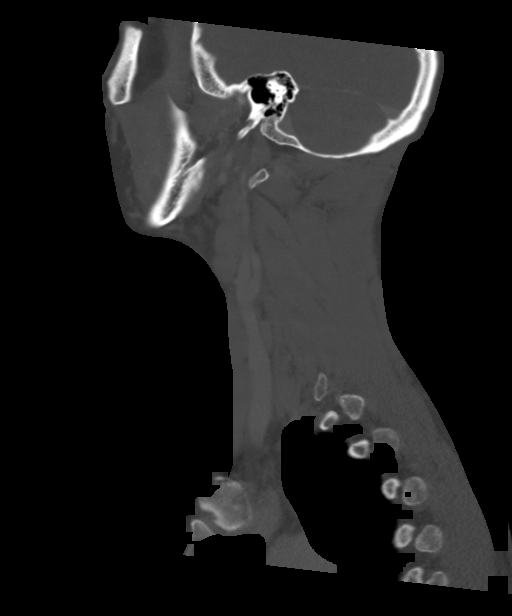
[im 46/110  bone]
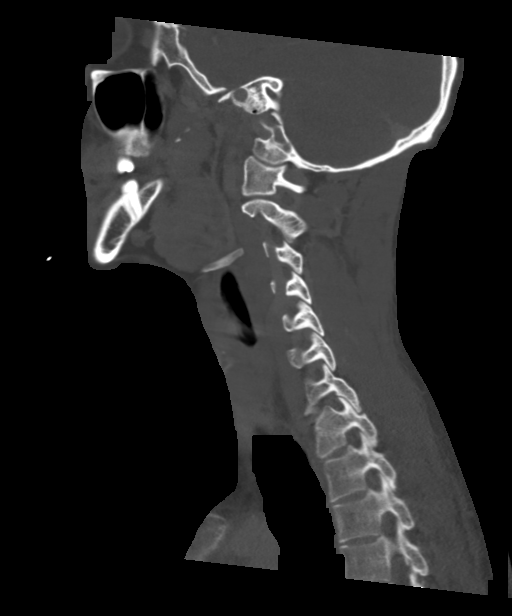
[im 55/110  soft-tissue]
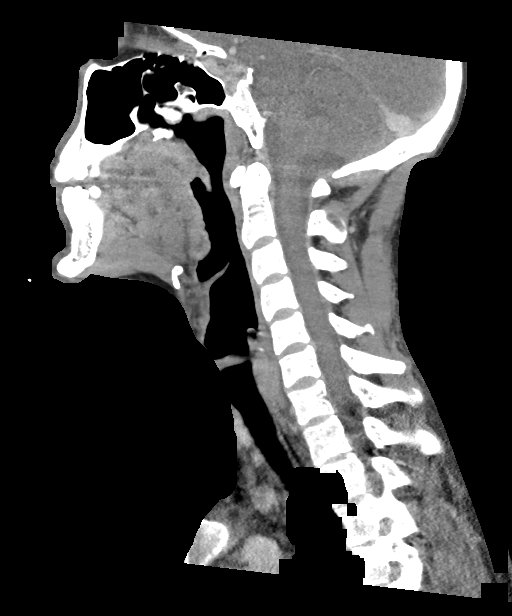
[im 55/110  bone]
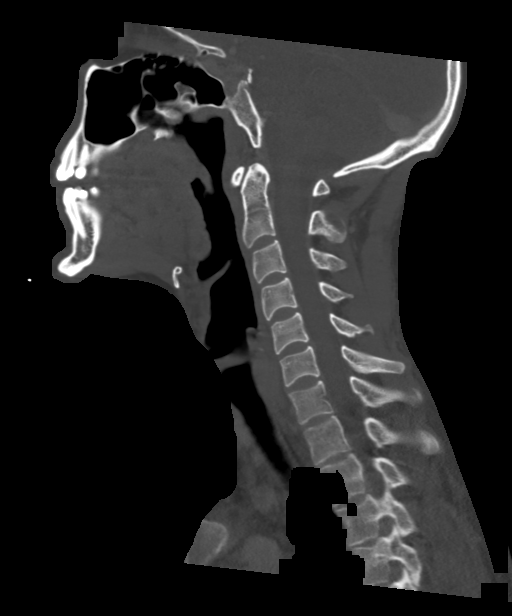
[im 64/110  bone]
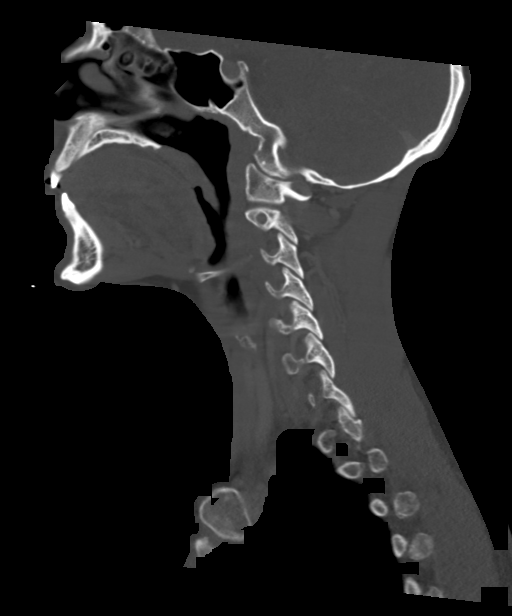
[im 73/110  bone]
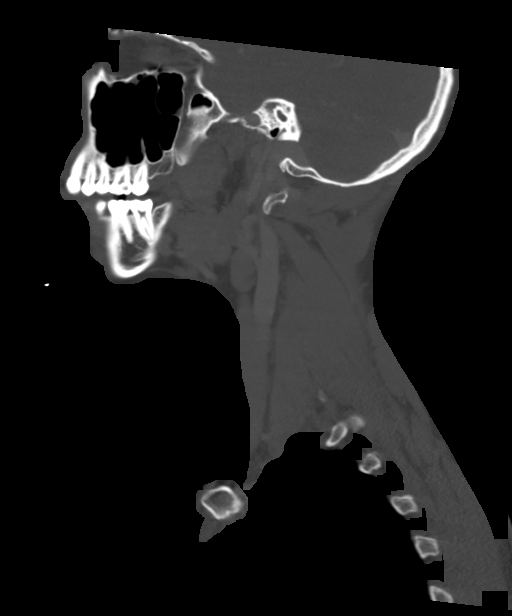

[Series 6: cor neck · coronal · 0.38mm/px · 3 of 145 slices shown]
[im 29/145  bone]
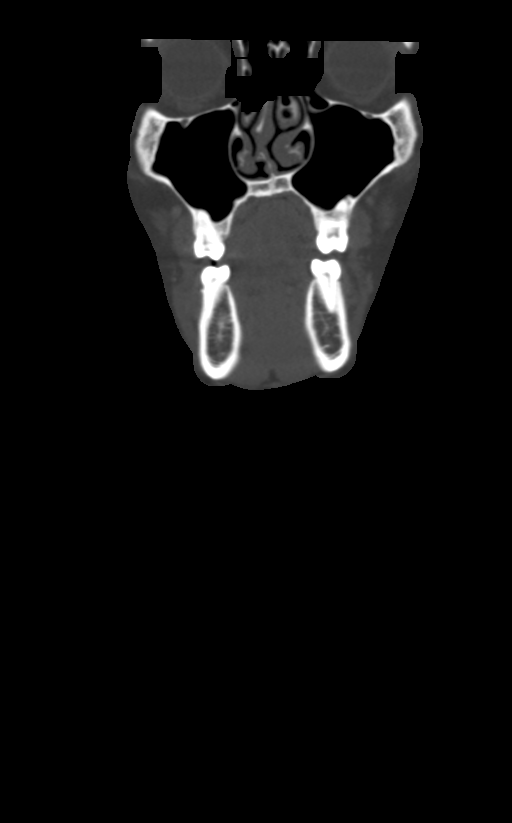
[im 58/145  bone]
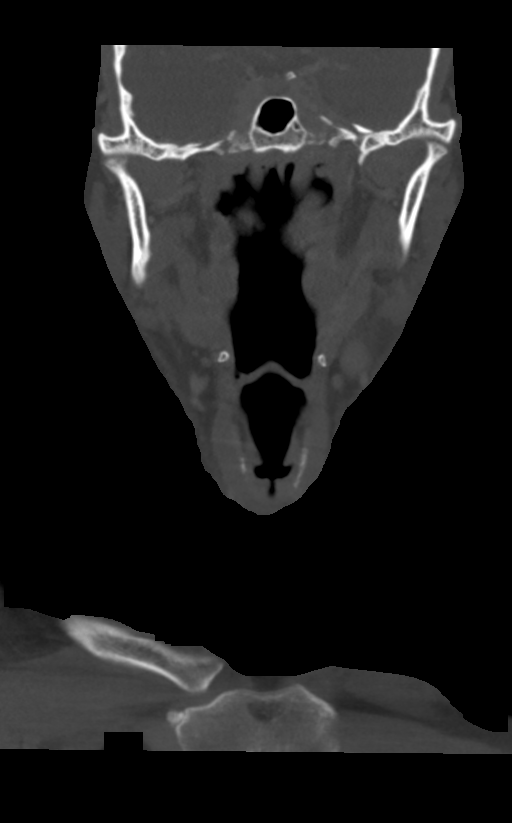
[im 87/145  bone]
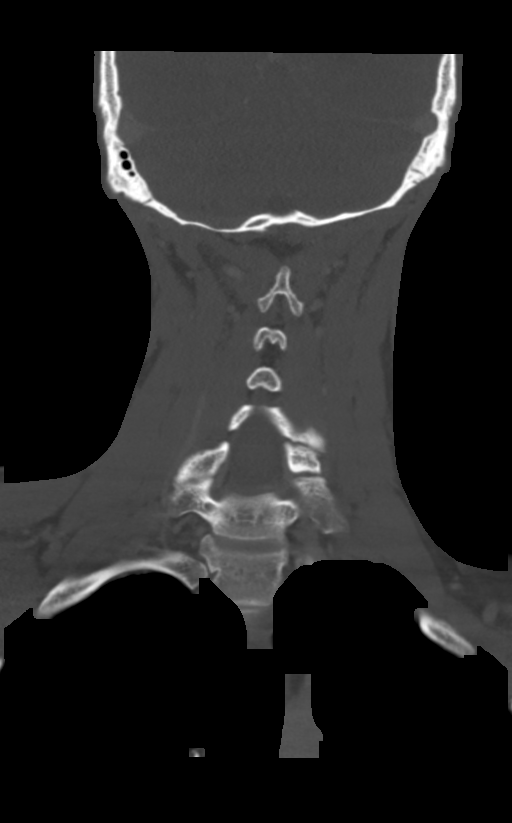

[13 of 35 positions shown; findings below may reference images not displayed]

FINDINGS: Pharynx and larynx: Oral cavity within normal limits. No acute
inflammatory changes seen about the dentition. Slight asymmetric
prominence of the left palatine tonsil as compared to the right
without discrete tonsillar or peritonsillar abscess. Additionally,
there is question of mild mucosal edema involving the a adjacent
left oropharynx (series 2, image 50). Given patient history, finding
suggestive of mild left-sided tonsillitis/pharyngitis. No
retropharyngeal swelling or collection. Epiglottis itself is normal.
Vallecula clear. Remainder of the hypopharynx and supraglottic
larynx within normal limits. Vocal cords symmetric and normal. Trace
layering secretions noted within the proximal subglottic airway.

Salivary glands: Salivary glands including the parotid and
submandibular glands are within normal limits.

Thyroid: Normal.

Lymph nodes: Mildly prominent left level II lymph nodes measure up
to 1.2 cm in short axis, nonspecific, but could be reactive. No
other enlarged or pathologic adenopathy within the neck.

Vascular: Normal intravascular enhancement seen throughout the neck.

Limited intracranial: Unremarkable.

Visualized orbits: Unremarkable.

Mastoids and visualized paranasal sinuses: Visualized paranasal
sinuses are clear. Mastoid air cells and middle ear cavities are
well pneumatized and free of fluid.

Skeleton: Unremarkable.

Upper chest: Unremarkable.

Other: None.
IMPRESSION: 1. Slight asymmetric prominence of the left palatine tonsil as
compared to the right with question of mild mucosal edema involving
the adjacent left oropharynx. Given patient history, finding
suggestive of mild left-sided tonsillitis/pharyngitis. No discrete
tonsillar or peritonsillar abscess.
2. Mildly prominent left level II lymph nodes, nonspecific, but
presumably reactive.

## 2023-08-27 DIAGNOSIS — R001 Bradycardia, unspecified: Secondary | ICD-10-CM | POA: Diagnosis not present

## 2023-08-27 DIAGNOSIS — J45909 Unspecified asthma, uncomplicated: Secondary | ICD-10-CM | POA: Diagnosis not present

## 2023-08-27 DIAGNOSIS — R079 Chest pain, unspecified: Secondary | ICD-10-CM | POA: Diagnosis not present

## 2023-08-27 DIAGNOSIS — R42 Dizziness and giddiness: Secondary | ICD-10-CM | POA: Diagnosis not present

## 2023-08-27 DIAGNOSIS — R61 Generalized hyperhidrosis: Secondary | ICD-10-CM | POA: Diagnosis not present

## 2023-08-27 DIAGNOSIS — F17220 Nicotine dependence, chewing tobacco, uncomplicated: Secondary | ICD-10-CM | POA: Diagnosis not present

## 2023-08-31 DIAGNOSIS — R079 Chest pain, unspecified: Secondary | ICD-10-CM | POA: Diagnosis not present

## 2023-08-31 DIAGNOSIS — R0609 Other forms of dyspnea: Secondary | ICD-10-CM | POA: Diagnosis not present

## 2023-08-31 DIAGNOSIS — R002 Palpitations: Secondary | ICD-10-CM | POA: Diagnosis not present

## 2023-08-31 DIAGNOSIS — R55 Syncope and collapse: Secondary | ICD-10-CM | POA: Diagnosis not present

## 2023-08-31 DIAGNOSIS — R9431 Abnormal electrocardiogram [ECG] [EKG]: Secondary | ICD-10-CM | POA: Diagnosis not present

## 2023-08-31 DIAGNOSIS — R001 Bradycardia, unspecified: Secondary | ICD-10-CM | POA: Diagnosis not present

## 2023-09-21 DIAGNOSIS — R55 Syncope and collapse: Secondary | ICD-10-CM | POA: Diagnosis not present

## 2023-09-21 DIAGNOSIS — R002 Palpitations: Secondary | ICD-10-CM | POA: Diagnosis not present

## 2023-09-28 DIAGNOSIS — R079 Chest pain, unspecified: Secondary | ICD-10-CM | POA: Diagnosis not present

## 2023-09-28 DIAGNOSIS — R0609 Other forms of dyspnea: Secondary | ICD-10-CM | POA: Diagnosis not present

## 2023-09-28 DIAGNOSIS — R55 Syncope and collapse: Secondary | ICD-10-CM | POA: Diagnosis not present

## 2023-09-28 DIAGNOSIS — R001 Bradycardia, unspecified: Secondary | ICD-10-CM | POA: Diagnosis not present

## 2023-09-28 DIAGNOSIS — R002 Palpitations: Secondary | ICD-10-CM | POA: Diagnosis not present

## 2023-11-08 DIAGNOSIS — S7012XA Contusion of left thigh, initial encounter: Secondary | ICD-10-CM | POA: Diagnosis not present
